# Patient Record
Sex: Female | Born: 1960 | Race: White | Hispanic: No | Marital: Married | Smoking: Former smoker
Health system: Southern US, Community
[De-identification: ages and names within clinical notes are randomized; demographics above are authoritative.]

## PROBLEM LIST (undated history)

## (undated) DIAGNOSIS — M1990 Unspecified osteoarthritis, unspecified site: Secondary | ICD-10-CM

## (undated) DIAGNOSIS — J45909 Unspecified asthma, uncomplicated: Secondary | ICD-10-CM

## (undated) HISTORY — PX: TUBAL LIGATION: SHX77

---

## 1999-01-11 ENCOUNTER — Encounter: Admission: RE | Admit: 1999-01-11 | Discharge: 1999-01-11 | Payer: Self-pay | Admitting: Family Medicine

## 1999-01-19 ENCOUNTER — Encounter: Admission: RE | Admit: 1999-01-19 | Discharge: 1999-01-19 | Payer: Self-pay | Admitting: Family Medicine

## 1999-02-09 ENCOUNTER — Encounter: Admission: RE | Admit: 1999-02-09 | Discharge: 1999-02-09 | Payer: Self-pay | Admitting: Family Medicine

## 1999-02-14 ENCOUNTER — Encounter: Admission: RE | Admit: 1999-02-14 | Discharge: 1999-02-14 | Payer: Self-pay | Admitting: Family Medicine

## 1999-03-01 ENCOUNTER — Encounter: Admission: RE | Admit: 1999-03-01 | Discharge: 1999-03-01 | Payer: Self-pay | Admitting: Family Medicine

## 1999-03-18 ENCOUNTER — Encounter: Admission: RE | Admit: 1999-03-18 | Discharge: 1999-03-18 | Payer: Self-pay | Admitting: Family Medicine

## 1999-05-19 ENCOUNTER — Encounter: Admission: RE | Admit: 1999-05-19 | Discharge: 1999-05-19 | Payer: Self-pay | Admitting: Family Medicine

## 1999-10-15 ENCOUNTER — Emergency Department (HOSPITAL_COMMUNITY): Admission: EM | Admit: 1999-10-15 | Discharge: 1999-10-15 | Payer: Self-pay | Admitting: *Deleted

## 2000-11-27 ENCOUNTER — Emergency Department (HOSPITAL_COMMUNITY): Admission: EM | Admit: 2000-11-27 | Discharge: 2000-11-27 | Payer: Self-pay | Admitting: Emergency Medicine

## 2001-01-13 ENCOUNTER — Ambulatory Visit (HOSPITAL_COMMUNITY): Admission: RE | Admit: 2001-01-13 | Discharge: 2001-01-13 | Payer: Self-pay | Admitting: *Deleted

## 2001-01-13 ENCOUNTER — Encounter: Payer: Self-pay | Admitting: General Practice

## 2001-11-06 ENCOUNTER — Ambulatory Visit: Admission: RE | Admit: 2001-11-06 | Discharge: 2001-11-06 | Payer: Self-pay | Admitting: *Deleted

## 2004-02-07 ENCOUNTER — Inpatient Hospital Stay (HOSPITAL_COMMUNITY): Admission: EM | Admit: 2004-02-07 | Discharge: 2004-02-09 | Payer: Self-pay | Admitting: Emergency Medicine

## 2010-11-13 ENCOUNTER — Encounter: Payer: Self-pay | Admitting: Family Medicine

## 2012-09-18 ENCOUNTER — Emergency Department (HOSPITAL_COMMUNITY)
Admission: EM | Admit: 2012-09-18 | Discharge: 2012-09-18 | Disposition: A | Discharge status: Home / Self Care | Payer: No Typology Code available for payment source | Attending: Emergency Medicine | Admitting: Emergency Medicine

## 2012-09-18 ENCOUNTER — Encounter (HOSPITAL_COMMUNITY): Payer: Self-pay | Admitting: *Deleted

## 2012-09-18 ENCOUNTER — Emergency Department (HOSPITAL_COMMUNITY): Payer: No Typology Code available for payment source

## 2012-09-18 DIAGNOSIS — S81009A Unspecified open wound, unspecified knee, initial encounter: Secondary | ICD-10-CM | POA: Insufficient documentation

## 2012-09-18 DIAGNOSIS — S81812A Laceration without foreign body, left lower leg, initial encounter: Secondary | ICD-10-CM

## 2012-09-18 DIAGNOSIS — S91009A Unspecified open wound, unspecified ankle, initial encounter: Secondary | ICD-10-CM | POA: Insufficient documentation

## 2012-09-18 DIAGNOSIS — Y9389 Activity, other specified: Secondary | ICD-10-CM | POA: Insufficient documentation

## 2012-09-18 DIAGNOSIS — Y92410 Unspecified street and highway as the place of occurrence of the external cause: Secondary | ICD-10-CM | POA: Insufficient documentation

## 2012-09-18 DIAGNOSIS — Z23 Encounter for immunization: Secondary | ICD-10-CM | POA: Insufficient documentation

## 2012-09-18 MED ORDER — TETANUS-DIPHTH-ACELL PERTUSSIS 5-2.5-18.5 LF-MCG/0.5 IM SUSP
0.5000 mL | Freq: Once | INTRAMUSCULAR | Status: AC
  Administered 2012-09-18: 0.5 mL via INTRAMUSCULAR
  Filled 2012-09-18: qty 0.5

## 2012-09-18 MED ORDER — HYDROCODONE-ACETAMINOPHEN 5-325 MG PO TABS
2.0000 | ORAL_TABLET | Freq: Once | ORAL | Status: AC
  Administered 2012-09-18: 2 via ORAL
  Filled 2012-09-18: qty 2
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
## 2012-09-18 NOTE — ED Notes (Signed)
MD at bedside. 
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
## 2012-10-02 ENCOUNTER — Encounter (HOSPITAL_COMMUNITY): Payer: Self-pay | Admitting: Emergency Medicine

## 2012-10-02 ENCOUNTER — Emergency Department (HOSPITAL_COMMUNITY)
Admission: EM | Admit: 2012-10-02 | Discharge: 2012-10-02 | Disposition: A | Discharge status: Home / Self Care | Payer: No Typology Code available for payment source | Attending: Emergency Medicine | Admitting: Emergency Medicine

## 2012-10-02 DIAGNOSIS — L02419 Cutaneous abscess of limb, unspecified: Secondary | ICD-10-CM | POA: Insufficient documentation

## 2012-10-02 DIAGNOSIS — IMO0002 Reserved for concepts with insufficient information to code with codable children: Secondary | ICD-10-CM

## 2012-10-02 DIAGNOSIS — L03116 Cellulitis of left lower limb: Secondary | ICD-10-CM

## 2012-10-02 HISTORY — DX: Unspecified osteoarthritis, unspecified site: M19.90

## 2012-10-02 MED ORDER — SULFAMETHOXAZOLE-TRIMETHOPRIM 800-160 MG PO TABS: 2.0000 | ORAL_TABLET | Freq: Two times a day (BID) | ORAL | Status: AC

## 2012-10-02 NOTE — ED Notes (Signed)
Pt reports increased redness and pain at suture line on l/calf. Marked redness noted. PA at bedside
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
## 2012-10-04 NOTE — ED Provider Notes (Signed)
Medical screening examination/treatment/procedure(s) were performed by non-physician practitioner and as supervising physician I was immediately available for consultation/collaboration.   Annaleese Guier, MD 10/04/12 1631 

## 2015-06-11 ENCOUNTER — Emergency Department (INDEPENDENT_AMBULATORY_CARE_PROVIDER_SITE_OTHER)
Admission: EM | Admit: 2015-06-11 | Discharge: 2015-06-11 | Disposition: A | Discharge status: Home / Self Care | Payer: Self-pay | Source: Home / Self Care | Attending: Family Medicine | Admitting: Family Medicine

## 2015-06-11 ENCOUNTER — Encounter (HOSPITAL_COMMUNITY): Payer: Self-pay | Admitting: Emergency Medicine

## 2015-06-11 DIAGNOSIS — L03115 Cellulitis of right lower limb: Secondary | ICD-10-CM

## 2015-06-11 MED ORDER — CEPHALEXIN 500 MG PO CAPS: 500.0000 mg | ORAL_CAPSULE | Freq: Four times a day (QID) | ORAL | Status: DC

## 2015-06-11 NOTE — ED Notes (Signed)
Called ortho tech 
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
## 2015-06-11 NOTE — ED Notes (Signed)
Spoke to ortho tech 

## 2015-06-11 NOTE — Discharge Instructions (Signed)
Leave leg wrapped until return on mon for recheck, take medicine as prescribed.

## 2015-06-14 ENCOUNTER — Encounter (HOSPITAL_COMMUNITY): Payer: Self-pay | Admitting: Emergency Medicine

## 2015-06-14 ENCOUNTER — Emergency Department (INDEPENDENT_AMBULATORY_CARE_PROVIDER_SITE_OTHER)
Admission: EM | Admit: 2015-06-14 | Discharge: 2015-06-14 | Disposition: A | Discharge status: Home / Self Care | Payer: Self-pay | Source: Home / Self Care | Attending: Family Medicine | Admitting: Family Medicine

## 2015-06-14 DIAGNOSIS — L03115 Cellulitis of right lower limb: Secondary | ICD-10-CM

## 2015-06-14 DIAGNOSIS — L0100 Impetigo, unspecified: Secondary | ICD-10-CM

## 2015-06-14 DIAGNOSIS — R609 Edema, unspecified: Secondary | ICD-10-CM

## 2015-06-14 MED ORDER — MUPIROCIN 2 % EX OINT: 1.0000 "application " | TOPICAL_OINTMENT | Freq: Two times a day (BID) | CUTANEOUS | Status: DC

## 2015-06-14 NOTE — ED Notes (Signed)
Patient was seen 8/19 for right lower leg wound.  Returned today for a recheck.  Una boot removed and lower leg cleansed with surclens.

## 2015-06-14 NOTE — Discharge Instructions (Signed)
The infection in your leg looks significantly better. Please continue with the antibiotics as prescribed. Please use the anabolic ointment in the future as needed for future wounds. Please remember to elevate your leg as much as possible. Please consider purchasing a pair of compression stockings to help with the fluid in your legs.

## 2015-06-14 NOTE — ED Provider Notes (Signed)
CSN: [id number]     Arrival date & time 06/14/15  1638 History   First MD Initiated Contact with Patient 06/14/15 1850     Chief Complaint  Patient presents with  . Wound Check   (Consider location/radiation/quality/duration/timing/severity/associated sxs/prior Treatment) HPI Patient suffered a right leg wound several weeks ago which turned into cellulitis. Patient was seen at the urgent care on 06/11/2015 and had an Unna boot placed and was put on antibiotic. Since that time she states that the wound has felt better. The edema has reduced significantly. She is taken anabolic as prescribed. Denies any fevers, joint effusions, rash, nausea, vomiting, diarrhea, abdominal pain, vaginal discharge or vaginal irritation. States that she has lower extremity edema at baseline. The problem is constant but getting better.   Past Medical History  Diagnosis Date  . Arthritis    Past Surgical History  Procedure Laterality Date  . Tubal ligation     Family History  Problem Relation Age of Onset  . Family history unknown: Yes   Social History  Substance Use Topics  . Smoking status: Never Smoker   . Smokeless tobacco: None  . Alcohol Use: No   OB History    No data available     Review of Systems Per HPI with all other pertinent systems negative.    Allergies  Review of patient's allergies indicates no known allergies.  Home Medications   Prior to Admission medications   Medication Sig Start Date End Date Taking? Authorizing Provider  albuterol (PROVENTIL HFA;VENTOLIN HFA) 108 (90 BASE) MCG/ACT inhaler Inhale 2 puffs into the lungs every 6 (six) hours as needed. For shortness of breath.    Historical Provider, MD  aspirin-acetaminophen-caffeine (EXCEDRIN MIGRAINE) [phone] MG per tablet Take 2 tablets by mouth every 8 (eight) hours as needed. For migraines.    Historical Provider, MD  cephALEXin (KEFLEX) 500 MG capsule Take 1 capsule (500 mg total) by mouth 4 (four) times daily. Take  all of medicine and drink lots of fluids 06/11/15   Linna Hoff, MD  ibuprofen (ADVIL,MOTRIN) 200 MG tablet Take 400 mg by mouth every 6 (six) hours as needed. Pain    Historical Provider, MD  mupirocin ointment (BACTROBAN) 2 % Apply 1 application topically 2 (two) times daily. 06/14/15   Ozella Rocks, MD   BP 149/96 mmHg  Pulse 69  Temp(Src) 98 F (36.7 C) (Oral)  Resp 16  SpO2 97% Physical Exam Physical Exam  Constitutional: oriented to person, place, and time. appears well-developed and well-nourished. No distress.  HENT:  Head: Normocephalic and atraumatic.  Eyes: EOMI. PERRL.  Neck: Normal range of motion.  Cardiovascular: RRR, no m/r/g, 2+ distal pulses,  Pulmonary/Chest: Effort normal and breath sounds normal. No respiratory distress.  Abdominal: Soft. Bowel sounds are normal. NonTTP, no distension.  Musculoskeletal: Normal range of motion. Non ttp, no effusion.  Neurological: alert and oriented to person, place, and time.  Skin: Mild skin maceration of the right lower anterior leg with 2 areas of ulceration. Minimal edema in the distribution of the Unna boot which was removed just prior to examination. Some surrounding erythema and induration present. Minimal tenderness to palpation.Marland Kitchen  Psychiatric: normal mood and affect. behavior is normal. Judgment and thought content normal.    ED Course  Procedures (including critical care time) Labs Review Labs Reviewed - No data to display  Imaging Review No results found.   MDM   1. Cellulitis of right lower extremity   2. Dependent edema  3. Impetigo    Continue antibiotic. No need for further in a boot. Patient start compression stockings for dependent edema. Mupirocin ointment for persistent weeping lesions or future open lesions as a preventative measure from this occurring again.    Ozella Rocks, MD 06/14/15 [phone]

## 2015-10-16 ENCOUNTER — Emergency Department (HOSPITAL_COMMUNITY): Payer: Self-pay

## 2015-10-16 ENCOUNTER — Encounter (HOSPITAL_COMMUNITY): Payer: Self-pay | Admitting: Emergency Medicine

## 2015-10-16 ENCOUNTER — Emergency Department (HOSPITAL_COMMUNITY)
Admission: EM | Admit: 2015-10-16 | Discharge: 2015-10-16 | Disposition: A | Discharge status: Home / Self Care | Payer: Self-pay | Attending: Emergency Medicine | Admitting: Emergency Medicine

## 2015-10-16 ENCOUNTER — Emergency Department (INDEPENDENT_AMBULATORY_CARE_PROVIDER_SITE_OTHER)
Admission: EM | Admit: 2015-10-16 | Discharge: 2015-10-16 | Disposition: A | Discharge status: Nursing Home (Includes ICF) | Payer: Self-pay | Source: Home / Self Care

## 2015-10-16 DIAGNOSIS — R42 Dizziness and giddiness: Secondary | ICD-10-CM | POA: Insufficient documentation

## 2015-10-16 DIAGNOSIS — R51 Headache: Secondary | ICD-10-CM | POA: Insufficient documentation

## 2015-10-16 DIAGNOSIS — M1990 Unspecified osteoarthritis, unspecified site: Secondary | ICD-10-CM | POA: Insufficient documentation

## 2015-10-16 LAB — CBC
HCT: 44 % (ref 36.0–46.0)
HEMOGLOBIN: 13.6 g/dL (ref 12.0–15.0)
MCH: 27.3 pg (ref 26.0–34.0)
MCHC: 30.9 g/dL (ref 30.0–36.0)
MCV: 88.4 fL (ref 78.0–100.0)
PLATELETS: 275 10*3/uL (ref 150–400)
RBC: 4.98 MIL/uL (ref 3.87–5.11)
RDW: 15.2 % (ref 11.5–15.5)
WBC: 6.9 10*3/uL (ref 4.0–10.5)

## 2015-10-16 LAB — URINALYSIS, ROUTINE W REFLEX MICROSCOPIC
BILIRUBIN URINE: NEGATIVE
Glucose, UA: NEGATIVE mg/dL
HGB URINE DIPSTICK: NEGATIVE
KETONES UR: NEGATIVE mg/dL
Leukocytes, UA: NEGATIVE
NITRITE: NEGATIVE
Protein, ur: NEGATIVE mg/dL
SPECIFIC GRAVITY, URINE: 1.009 (ref 1.005–1.030)
pH: 6.5 (ref 5.0–8.0)

## 2015-10-16 LAB — BASIC METABOLIC PANEL
ANION GAP: 9 (ref 5–15)
BUN: 11 mg/dL (ref 6–20)
CALCIUM: 9.6 mg/dL (ref 8.9–10.3)
CO2: 27 mmol/L (ref 22–32)
CREATININE: 0.82 mg/dL (ref 0.44–1.00)
Chloride: 103 mmol/L (ref 101–111)
GFR calc Af Amer: >60 mL/min (ref >60)
GLUCOSE: 109 mg/dL — AB (ref 65–99)
Potassium: 4.5 mmol/L (ref 3.5–5.1)
Sodium: 139 mmol/L (ref 135–145)

## 2015-10-16 MED ORDER — MECLIZINE HCL 25 MG PO TABS
25.0000 mg | ORAL_TABLET | Freq: Once | ORAL | Status: AC
  Administered 2015-10-16: 25 mg via ORAL
  Filled 2015-10-16: qty 1

## 2015-10-16 MED ORDER — SODIUM CHLORIDE 0.9 % IV BOLUS (SEPSIS)
1000.0000 mL | Freq: Once | INTRAVENOUS | Status: AC
Start: 2015-10-16 — End: 2015-10-16
  Administered 2015-10-16: 1000 mL via INTRAVENOUS

## 2015-10-16 MED ORDER — MECLIZINE HCL 25 MG PO TABS: 25.0000 mg | ORAL_TABLET | Freq: Three times a day (TID) | ORAL | Status: DC | PRN

## 2015-10-16 MED ORDER — METOCLOPRAMIDE HCL 5 MG/ML IJ SOLN
10.0000 mg | Freq: Once | INTRAMUSCULAR | Status: AC
  Administered 2015-10-16: 10 mg via INTRAVENOUS
  Filled 2015-10-16: qty 2

## 2015-10-16 MED ORDER — DIPHENHYDRAMINE HCL 50 MG/ML IJ SOLN
25.0000 mg | Freq: Once | INTRAMUSCULAR | Status: AC
  Administered 2015-10-16: 25 mg via INTRAVENOUS
  Filled 2015-10-16: qty 1

## 2015-10-16 NOTE — ED Provider Notes (Signed)
CSN: [id number]     Arrival date & time 10/16/15  1209 History   None    Chief Complaint  Patient presents with  . Dizziness   (Consider location/radiation/quality/duration/timing/severity/associated sxs/prior Treatment) HPI History obtained from patient:     Dizzy since yesterday. Symptoms are worse looking to the right. Some nausea. Symptoms acute onset yesterday morning. Was able to work.  Symptoms more constant today, yesterday several hours between episodes. 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  States symptoms just slowly get worse. 2 migraine type headaches since dizziness started.  Feels a little nauseated. No home treatment except lying down in bed. Symptoms are made worse with looking to the right or lifting her head. Also has weakness trying to get out of chair. Denies Head injury. Admits to some blurred vision.  Past Medical History  Diagnosis Date  . Arthritis    Past Surgical History  Procedure Laterality Date  . Tubal ligation     Family History  Problem Relation Age of Onset  . Family history unknown: Yes   Social History  Substance Use Topics  . Smoking status: Never Smoker   . Smokeless tobacco: None  . Alcohol Use: No   OB History    No data available     Review of Systems ROS +'ve dizziness, Headache Denies:  , NAUSEA, ABDOMINAL PAIN, CHEST PAIN, CONGESTION, DYSURIA, SHORTNESS OF BREATH  Allergies  Review of patient's allergies indicates no known allergies.  Home Medications   Prior to Admission medications   Medication Sig Start Date End Date Taking? Authorizing Provider  albuterol (PROVENTIL HFA;VENTOLIN HFA) 108 (90 BASE) MCG/ACT inhaler Inhale 2 puffs into the lungs every 6 (six) hours as needed. For shortness of breath.    Historical Provider, MD  aspirin-acetaminophen-caffeine (EXCEDRIN MIGRAINE) [phone] MG per tablet Take 2 tablets by mouth every 8 (eight) hours as needed. For migraines.    Historical Provider, MD  cephALEXin (KEFLEX) 500 MG capsule Take  1 capsule (500 mg total) by mouth 4 (four) times daily. Take all of medicine and drink lots of fluids 06/11/15   Linna HoffJames D Kindl, MD  ibuprofen (ADVIL,MOTRIN) 200 MG tablet Take 400 mg by mouth every 6 (six) hours as needed. Pain    Historical Provider, MD  mupirocin ointment (BACTROBAN) 2 % Apply 1 application topically 2 (two) times daily. 06/14/15   Ozella Rocksavid J Merrell, MD   Meds Ordered and Administered this Visit  Medications - No data to display  BP 81/56 mmHg  Pulse 64  Temp(Src) 97.5 F (36.4 C) (Oral)  Resp 20  SpO2 97% Orthostatic VS for the past 24 hrs:  BP- Lying Pulse- Lying BP- Sitting Pulse- Sitting BP- Standing at 0 minutes Pulse- Standing at 0 minutes  10/16/15 1238 (!) 167/97 mmHg 70 (!) 158/108 mmHg 64 (!) 172/126 mmHg 82    Physical Exam  Constitutional: She is oriented to person, place, and time. She appears well-developed and well-nourished. No distress.  HENT:  Head: Normocephalic and atraumatic.  Right Ear: External ear normal.  Left Ear: External ear normal.  Mouth/Throat: Oropharynx is clear and moist.  Eyes: Conjunctivae are normal. Pupils are equal, round, and reactive to light. Left eye exhibits nystagmus.  Pulmonary/Chest: Effort normal and breath sounds normal.  Neurological: She is alert and oriented to person, place, and time.  Skin: Skin is warm and dry.  Psychiatric: She has a normal mood and affect. Her behavior is normal. Judgment and thought content normal.  Nursing note and vitals reviewed.  ED Course  Procedures (including critical care time)  Labs Review Labs Reviewed - No data to display  Imaging Review No results found.   Visual Acuity Review  Right Eye Distance:   Left Eye Distance:   Bilateral Distance:    Right Eye Near:   Left Eye Near:    Bilateral Near:         MDM   1. Vertigo     Patient's multitude inconstant location of symptoms are greater than the ability of urgent care to care for and therefore she is  referred to the emergency department. Patient has obvious nystagmus on evaluation she is also hypertensive. She states that she usually wants a low blood pressure. She does have vertigo. She does have right temporal pain and the differential includes temporal arteritis. I have advised her to go to the emergency department for further testing and evaluation provided decision-making the emergency department provider. Instructions of care provided.      Tharon Aquas, PA 10/16/15 1309

## 2015-10-16 NOTE — ED Notes (Signed)
Dr. Yao at bedside. 

## 2015-10-16 NOTE — ED Notes (Addendum)
Pt sent from urgent care for further eval of dizziness onset yesterday morning when she tried to get up. No facial droop, speech clear, equal grips, no arm drift. Pt reports migraine on Tuesday and Thursday. Pt reports when she turns her head to the right the dizziness increases.

## 2015-10-16 NOTE — Discharge Instructions (Signed)
Stay hydrated.   Take meclizine for dizziness.   See your doctor.   Return to ER if you have worse dizziness, vomiting, headaches.

## 2015-10-16 NOTE — ED Provider Notes (Signed)
CSN: [id number]     Arrival date & time 10/16/15  1318 History   First MD Initiated Contact with Patient 10/16/15 1951     Chief Complaint  Patient presents with  . Dizziness     (Consider location/radiation/quality/duration/timing/severity/associated sxs/prior Treatment) The history is provided by the patient.  Cynthia Meyer is a 54 y.o. female here with dizziness. Patient states that she has been feeling dizzy since yesterday. Has some headaches as well. Patient states that is worse when she sits up or stands up. Denies any vomiting. Denies any fevers or neck pain. She went to urgent care and was sent here for further evaluation. Denies history of strokes.       Past Medical History  Diagnosis Date  . Arthritis    Past Surgical History  Procedure Laterality Date  . Tubal ligation     Family History  Problem Relation Age of Onset  . Family history unknown: Yes   Social History  Substance Use Topics  . Smoking status: Never Smoker   . Smokeless tobacco: None  . Alcohol Use: No   OB History    No data available     Review of Systems  Neurological: Positive for dizziness.  All other systems reviewed and are negative.     Allergies  Review of patient's allergies indicates no known allergies.  Home Medications   Prior to Admission medications   Medication Sig Start Date End Date Taking? Authorizing Provider  OVER THE COUNTER MEDICATION Apply 1 application topically daily as needed (for wound drying).   Yes Historical Provider, MD  albuterol (PROVENTIL HFA;VENTOLIN HFA) 108 (90 BASE) MCG/ACT inhaler Inhale 2 puffs into the lungs every 6 (six) hours as needed. For shortness of breath.    Historical Provider, MD  aspirin-acetaminophen-caffeine (EXCEDRIN MIGRAINE) [phone] MG per tablet Take 2 tablets by mouth every 8 (eight) hours as needed. For migraines.    Historical Provider, MD  ibuprofen (ADVIL,MOTRIN) 200 MG tablet Take 400 mg by mouth every 6 (six) hours as  needed. Pain    Historical Provider, MD  meclizine (ANTIVERT) 25 MG tablet Take 1 tablet (25 mg total) by mouth 3 (three) times daily as needed for dizziness. 10/16/15   Richardean Canalavid H Khyri Hinzman, MD   BP 123/75 mmHg  Pulse 71  Temp(Src) 97.8 F (36.6 C) (Oral)  Resp 20  Ht 5\' 6"  (1.676 m)  Wt 289 lb (131.09 kg)  BMI 46.67 kg/m2  SpO2 97% Physical Exam  Constitutional: She is oriented to person, place, and time. She appears well-developed and well-nourished.  HENT:  Head: Normocephalic.  Mouth/Throat: Oropharynx is clear and moist.  Eyes: Conjunctivae are normal. Pupils are equal, round, and reactive to light.  No obvious nystagmus   Neck: Normal range of motion. Neck supple.  Cardiovascular: Normal rate, regular rhythm and normal heart sounds.   Pulmonary/Chest: Effort normal and breath sounds normal. No respiratory distress. She has no wheezes. She has no rales.  Abdominal: Soft. Bowel sounds are normal. She exhibits no distension. There is no tenderness. There is no rebound.  Musculoskeletal: Normal range of motion.  Neurological: She is alert and oriented to person, place, and time. No cranial nerve deficit. Coordination normal.  Nl gait, CN 2-12 intact. Nl finger to nose   Skin: Skin is warm and dry.  Psychiatric: She has a normal mood and affect. Her behavior is normal. Judgment and thought content normal.  Nursing note and vitals reviewed.   ED Course  Procedures (including  critical care time) Labs Review Labs Reviewed  BASIC METABOLIC PANEL - Abnormal; Notable for the following:    Glucose, Bld 109 (*)    All other components within normal limits  CBC  URINALYSIS, ROUTINE W REFLEX MICROSCOPIC (NOT AT [facility])    Imaging Review Ct Head Wo Contrast  10/16/2015  CLINICAL DATA:  Dizziness for 2 days. Recent headache. Unable to sit up yesterday. EXAM: CT HEAD WITHOUT CONTRAST TECHNIQUE: Contiguous axial images were obtained from the base of the skull through the vertex without  intravenous contrast. COMPARISON:  None. FINDINGS: No acute cortical infarct, hemorrhage, or mass lesion is present. The ventricles are of normal size. No significant extra-axial fluid collection is evident. The paranasal sinuses and mastoid air cells are clear. The calvarium is intact. The globes and orbits are intact. 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 This appears benign. No focal scalp lesion is present. IMPRESSION: Negative CT of the head. Electronically Signed   By: Marin Roberts M.D.   On: 10/16/2015 20:45   I have personally reviewed and evaluated these images and lab results as part of my medical decision-making.   EKG Interpretation None      MDM   Final diagnoses:  Vertigo   Cynthia Meyer is a 54 y.o. female here with dizziness. Likely peripheral vertigo. CT head nl. Felt better with meclizine. Has nl gait. Likely peripheral vertigo. Will dc home with meclizine.    Richardean Canal, MD 10/16/15 [phone]

## 2015-10-16 NOTE — ED Notes (Signed)
Pt ambulatory to restroom; reports mild improvement of dizziness with position changes

## 2015-10-16 NOTE — ED Notes (Signed)
C/o feeling dizzy onset yest am associated w/nauseas and chills Reports sx increases when she turns to the right A&O x4... No acute distress.

## 2020-04-06 ENCOUNTER — Other Ambulatory Visit: Payer: Self-pay

## 2020-04-06 ENCOUNTER — Ambulatory Visit (HOSPITAL_COMMUNITY)
Admission: EM | Admit: 2020-04-06 | Discharge: 2020-04-06 | Disposition: A | Discharge status: Home / Self Care | Payer: HRSA Program | Attending: Internal Medicine | Admitting: Internal Medicine

## 2020-04-06 ENCOUNTER — Encounter (HOSPITAL_COMMUNITY): Payer: Self-pay

## 2020-04-06 ENCOUNTER — Emergency Department (HOSPITAL_COMMUNITY): Payer: HRSA Program

## 2020-04-06 ENCOUNTER — Encounter (HOSPITAL_COMMUNITY): Payer: Self-pay | Admitting: Emergency Medicine

## 2020-04-06 ENCOUNTER — Emergency Department (HOSPITAL_COMMUNITY)
Admission: EM | Admit: 2020-04-06 | Discharge: 2020-04-06 | Disposition: A | Discharge status: Home / Self Care | Payer: HRSA Program | Attending: Emergency Medicine | Admitting: Emergency Medicine

## 2020-04-06 DIAGNOSIS — U071 COVID-19: Secondary | ICD-10-CM | POA: Insufficient documentation

## 2020-04-06 DIAGNOSIS — J45909 Unspecified asthma, uncomplicated: Secondary | ICD-10-CM | POA: Insufficient documentation

## 2020-04-06 DIAGNOSIS — J129 Viral pneumonia, unspecified: Secondary | ICD-10-CM | POA: Diagnosis not present

## 2020-04-06 DIAGNOSIS — R0682 Tachypnea, not elsewhere classified: Secondary | ICD-10-CM

## 2020-04-06 DIAGNOSIS — R0789 Other chest pain: Secondary | ICD-10-CM

## 2020-04-06 DIAGNOSIS — Q8740 Marfan's syndrome, unspecified: Secondary | ICD-10-CM | POA: Insufficient documentation

## 2020-04-06 DIAGNOSIS — Z79899 Other long term (current) drug therapy: Secondary | ICD-10-CM | POA: Diagnosis not present

## 2020-04-06 DIAGNOSIS — R0602 Shortness of breath: Secondary | ICD-10-CM | POA: Diagnosis present

## 2020-04-06 DIAGNOSIS — R0603 Acute respiratory distress: Secondary | ICD-10-CM

## 2020-04-06 HISTORY — DX: Unspecified asthma, uncomplicated: J45.909

## 2020-04-06 LAB — TROPONIN I (HIGH SENSITIVITY): Troponin I (High Sensitivity): 6 ng/L (ref <18)

## 2020-04-06 LAB — CBC WITH DIFFERENTIAL/PLATELET
Abs Immature Granulocytes: 0.04 10*3/uL (ref 0.00–0.07)
Basophils Absolute: 0 10*3/uL (ref 0.0–0.1)
Basophils Relative: 0 %
Eosinophils Absolute: 0.1 10*3/uL (ref 0.0–0.5)
Eosinophils Relative: 1 %
HCT: 46.4 % — ABNORMAL HIGH (ref 36.0–46.0)
Hemoglobin: 14.2 g/dL (ref 12.0–15.0)
Immature Granulocytes: 1 %
Lymphocytes Relative: 24 %
Lymphs Abs: 1.2 10*3/uL (ref 0.7–4.0)
MCH: 26.9 pg (ref 26.0–34.0)
MCHC: 30.6 g/dL (ref 30.0–36.0)
MCV: 87.9 fL (ref 80.0–100.0)
Monocytes Absolute: 0.4 10*3/uL (ref 0.1–1.0)
Monocytes Relative: 7 %
Neutro Abs: 3.5 10*3/uL (ref 1.7–7.7)
Neutrophils Relative %: 67 %
Platelets: 168 10*3/uL (ref 150–400)
RBC: 5.28 MIL/uL — ABNORMAL HIGH (ref 3.87–5.11)
RDW: 16 % — ABNORMAL HIGH (ref 11.5–15.5)
WBC: 5.2 10*3/uL (ref 4.0–10.5)
nRBC: 0 % (ref 0.0–0.2)

## 2020-04-06 LAB — COMPREHENSIVE METABOLIC PANEL
ALT: 29 U/L (ref 0–44)
AST: 37 U/L (ref 15–41)
Albumin: 3.2 g/dL — ABNORMAL LOW (ref 3.5–5.0)
Alkaline Phosphatase: 70 U/L (ref 38–126)
Anion gap: 10 (ref 5–15)
BUN: 13 mg/dL (ref 6–20)
CO2: 25 mmol/L (ref 22–32)
Calcium: 8.4 mg/dL — ABNORMAL LOW (ref 8.9–10.3)
Chloride: 100 mmol/L (ref 98–111)
Creatinine, Ser: 0.87 mg/dL (ref 0.44–1.00)
GFR calc Af Amer: >60 mL/min (ref >60)
GFR calc non Af Amer: >60 mL/min (ref >60)
Glucose, Bld: 145 mg/dL — ABNORMAL HIGH (ref 70–99)
Potassium: 4.7 mmol/L (ref 3.5–5.1)
Sodium: 135 mmol/L (ref 135–145)
Total Bilirubin: 0.8 mg/dL (ref 0.3–1.2)
Total Protein: 6.4 g/dL — ABNORMAL LOW (ref 6.5–8.1)

## 2020-04-06 LAB — SARS CORONAVIRUS 2 BY RT PCR (HOSPITAL ORDER, PERFORMED IN ~~LOC~~ HOSPITAL LAB): SARS Coronavirus 2: POSITIVE — AB

## 2020-04-06 LAB — PROTIME-INR
INR: 0.9 (ref 0.8–1.2)
Prothrombin Time: 11.8 seconds (ref 11.4–15.2)

## 2020-04-06 LAB — I-STAT CHEM 8, ED
BUN: 16 mg/dL (ref 6–20)
Calcium, Ion: 0.97 mmol/L — ABNORMAL LOW (ref 1.15–1.40)
Chloride: 101 mmol/L (ref 98–111)
Creatinine, Ser: 0.8 mg/dL (ref 0.44–1.00)
Glucose, Bld: 139 mg/dL — ABNORMAL HIGH (ref 70–99)
HCT: 45 % (ref 36.0–46.0)
Hemoglobin: 15.3 g/dL — ABNORMAL HIGH (ref 12.0–15.0)
Potassium: 4.1 mmol/L (ref 3.5–5.1)
Sodium: 136 mmol/L (ref 135–145)
TCO2: 27 mmol/L (ref 22–32)

## 2020-04-06 MED ORDER — SODIUM CHLORIDE 0.9% FLUSH
3.0000 mL | Freq: Once | INTRAVENOUS | Status: AC
  Administered 2020-04-06: 3 mL via INTRAVENOUS

## 2020-04-06 MED ORDER — PREDNISONE 20 MG PO TABS
ORAL_TABLET | ORAL | 0 refills | Status: AC
Start: 2020-04-06 — End: ?

## 2020-04-06 MED ORDER — IOHEXOL 350 MG/ML SOLN
100.0000 mL | Freq: Once | INTRAVENOUS | Status: AC | PRN
  Administered 2020-04-06: 100 mL via INTRAVENOUS

## 2020-04-06 MED ORDER — PREDNISONE 20 MG PO TABS
60.0000 mg | ORAL_TABLET | Freq: Once | ORAL | Status: AC
  Administered 2020-04-06: 60 mg via ORAL
  Filled 2020-04-06: qty 3

## 2020-04-06 MED ORDER — LEVOFLOXACIN 750 MG PO TABS
750.0000 mg | ORAL_TABLET | Freq: Every day | ORAL | 0 refills | Status: DC
Start: 2020-04-06 — End: 2020-04-15

## 2020-04-06 MED ORDER — LEVOFLOXACIN 750 MG PO TABS
750.0000 mg | ORAL_TABLET | Freq: Once | ORAL | Status: AC
  Administered 2020-04-06: 750 mg via ORAL
  Filled 2020-04-06: qty 1

## 2020-04-06 MED ORDER — ALBUTEROL SULFATE HFA 108 (90 BASE) MCG/ACT IN AERS
2.0000 | INHALATION_SPRAY | Freq: Once | RESPIRATORY_TRACT | Status: AC
  Administered 2020-04-06: 2 via RESPIRATORY_TRACT
  Filled 2020-04-06: qty 6.7

## 2020-04-06 NOTE — ED Triage Notes (Signed)
Pt brought in [facility] from Urgent Care.   Pt's PA wanted her to be brought in to rule out a PE, based off of pt symptoms of chest pain since last thrusday and fatigue.  Pt denies history of PE, brought in on 2L.    Pt a&ox4, ambulatory on arrival.

## 2020-04-06 NOTE — Care Management (Signed)
ED CM received consult from EDP for [facility] patient is positive for Covid-19.  EDP spoke with patient regarding [facility] services patient is agreeable to accept service. ED CM contacted [facility] spoke with Dewayne Hatch who accepted the referral, which was  faxed in as well.  EDP mentioned patient may need home oxygen explained that patient O2 sats would have need to have dropped to 88% to qualify.  Explained that patient will be evaluated further by [facility] at home.

## 2020-04-06 NOTE — ED Provider Notes (Addendum)
[facility] EMERGENCY DEPARTMENT Provider Note   CSN: [id number] Arrival date & time: 04/06/20  2038     History Chief Complaint  Patient presents with  . Chest Pain    Cynthia Meyer is a 59 y.o. female hx of asthma, here presenting with shortness of breath.  Patient states that she recently traveled to [place] and drove back.  She states that she has some shortness of breath on exertion for the last several days.  She states that she has poor appetite overall and feels tired.  She feels like she cannot take a deep breath.  She went to urgent care was sent in for rule out ACS or PE.   The history is provided by the patient.       Past Medical History:  Diagnosis Date  . Arthritis   . Asthma     There are no problems to display for this patient.   Past Surgical History:  Procedure Laterality Date  . TUBAL LIGATION       OB History   No obstetric history on file.     Family History  Family history unknown: Yes    Social History   Tobacco Use  . Smoking status: Never Smoker  Substance Use Topics  . Alcohol use: No  . Drug use: Never    Home Medications Prior to Admission medications   Medication Sig Start Date End Date Taking? Authorizing Provider  albuterol (PROVENTIL HFA;VENTOLIN HFA) 108 (90 BASE) MCG/ACT inhaler Inhale 2 puffs into the lungs every 6 (six) hours as needed. For shortness of breath.    [provider]  aspirin-acetaminophen-caffeine (EXCEDRIN MIGRAINE) [phone] MG per tablet Take 2 tablets by mouth every 8 (eight) hours as needed. For migraines.    [provider]  ibuprofen (ADVIL,MOTRIN) 200 MG tablet Take 400 mg by mouth every 6 (six) hours as needed. Pain    [provider]  meclizine (ANTIVERT) 25 MG tablet Take 1 tablet (25 mg total) by mouth 3 (three) times daily as needed for dizziness. 10/16/15   Drenda Freeze, MD  OVER THE COUNTER MEDICATION Apply 1 application topically daily as  needed (for wound drying).    [provider]    Allergies    Patient has no known allergies.  Review of Systems   Review of Systems  Respiratory: Positive for shortness of breath.   Cardiovascular: Positive for chest pain.  All other systems reviewed and are negative.   Physical Exam Updated Vital Signs BP 139/81   Pulse 79   Temp 98.4 F (36.9 C) (Oral)   Resp 19   SpO2 95%   Physical Exam Vitals and nursing note reviewed.  HENT:     Head: Normocephalic.  Eyes:     Pupils: Pupils are equal, round, and reactive to light.  Cardiovascular:     Rate and Rhythm: Normal rate and regular rhythm.     Heart sounds: Normal heart sounds.  Pulmonary:     Effort: Pulmonary effort is normal.     Breath sounds: Normal breath sounds.  Abdominal:     General: Bowel sounds are normal.     Palpations: Abdomen is soft.  Musculoskeletal:        General: Normal range of motion.     Cervical back: Normal range of motion and neck supple.  Skin:    General: Skin is warm.     Capillary Refill: Capillary refill takes less than 2 seconds.  Neurological:     General: No focal deficit present.     Mental Status: She is alert and oriented to person, place, and time.  Psychiatric:        Mood and Affect: Mood normal.        Behavior: Behavior normal.     ED Results / Procedures / Treatments   Labs (all labs ordered are listed, but only abnormal results are displayed) Labs Reviewed  CBC WITH DIFFERENTIAL/PLATELET - Abnormal; Notable for the following components:      Result Value   RBC 5.28 (*)    HCT 46.4 (*)    RDW 16.0 (*)    All other components within normal limits  I-STAT CHEM 8, ED - Abnormal; Notable for the following components:   Glucose, Bld 139 (*)    Calcium, Ion 0.97 (*)    Hemoglobin 15.3 (*)    All other components within normal limits  SARS CORONAVIRUS 2 BY RT PCR (HOSPITAL ORDER, PERFORMED IN [facility])  PROTIME-INR  COMPREHENSIVE  METABOLIC PANEL  I-STAT BETA HCG BLOOD, ED ([facility], [facility], [facility] ONLY)  TROPONIN I (HIGH SENSITIVITY)    EKG EKG Interpretation  Date/Time:  Tuesday April 06 2020 20:45:01 EDT Ventricular Rate:  82 PR Interval:    QRS Duration: 88 QT Interval:  372 QTC Calculation: 435 R Axis:   69 Text Interpretation: Sinus rhythm Low voltage, precordial leads Borderline T abnormalities, anterior leads No significant change since last tracing Confirmed by Richardean Canal [phone]) on 04/06/2020 8:50:34 PM   Radiology DG Chest Port 1 View  Result Date: 04/06/2020 CLINICAL DATA:  Chest pain, fatigue EXAM: PORTABLE CHEST 1 VIEW COMPARISON:  02/06/2004 FINDINGS: Single frontal view of the chest demonstrates an unremarkable cardiac silhouette. No airspace disease, effusion, or pneumothorax. Chronic elevation right hemidiaphragm. IMPRESSION: 1. No acute intrathoracic process. Electronically Signed   By: Sharlet Salina M.D.   On: 04/06/2020 21:02    Procedures Procedures (including critical care time)  Medications Ordered in ED Medications  sodium chloride flush (NS) 0.9 % injection 3 mL (3 mLs Intravenous Given 04/06/20 2047)    ED Course  I have reviewed the triage vital signs and the nursing notes.  Pertinent labs & imaging results that were available during my care of the patient were reviewed by me and considered in my medical decision making (see chart for details).    MDM Rules/Calculators/A&P                          Cynthia Meyer is a 59 y.o. female here with chest pain and shortness of breath. Had recent travel so consider PE. Consider ACS as well and symptoms for 4 days so trop x 1 sufficient. I doubt dissection. Will get cbc, cmp, trop, CXR, CTA chest.    10:31 PM WBC is normal. Trop neg x 1. CT chest showed no PE, but there is pneumonia.  Patient has some nonproductive cough and chills.  Patient is afebrile here and is not hypoxic.  Her Covid test came back to be positive.  Since she is not hypoxic,  patient can be discharged home. Will refill her albuterol we will give a course of steroids and Levaquin. Told her to quarantine at home for 10 days. She qualifies for COVID to home program and Burna Mortimer the case manager will help set it up. She can get 2 L [place] as needed.   Cynthia Meyer was  evaluated in Emergency Department on 04/06/2020 for the symptoms described in the history of present illness. She was evaluated in the context of the global COVID-19 pandemic, which necessitated consideration that the patient might be at risk for infection with the SARS-CoV-2 virus that causes COVID-19. Institutional protocols and algorithms that pertain to the evaluation of patients at risk for COVID-19 are in a state of rapid change based on information released by regulatory bodies including the CDC and federal and state organizations. These policies and algorithms were followed during the patient's care in the ED.   Final Clinical Impression(s) / ED Diagnoses Final diagnoses:  None    Rx / DC Orders ED Discharge Orders    None       Charlynne Pander, MD 04/06/20 2248    Charlynne Pander, MD 04/06/20 2253

## 2020-04-06 NOTE — Discharge Instructions (Addendum)
Take prednisone as prescribed.   Take levaquin as prescribed   Use albuterol as needed for shortness of breath   Your shortness of breath is from COVID and pneumonia   Home health will check in on you tomorrow to set up oxygen delivery and check in on you   Quarantine for 10 days. See guidelines below   Return to ER if you have worse shortness of breath, fever, trouble breathing.      Person Under Monitoring Name: Cynthia Meyer  Location: [address] Cynthia Meyer [place] [address]   Infection Prevention Recommendations for Individuals Confirmed to have, or Being Evaluated for, 2019 Novel Coronavirus (COVID-19) Infection Who Receive Care at Home  Individuals who are confirmed to have, or are being evaluated for, COVID-19 should follow the prevention steps below until a healthcare provider or local or state health department says they can return to normal activities.  Stay home except to get medical care You should restrict activities outside your home, except for getting medical care. Do not go to work, school, or public areas, and do not use public transportation or taxis.  Call ahead before visiting your doctor Before your medical appointment, call the healthcare provider and tell them that you have, or are being evaluated for, COVID-19 infection. This will help the healthcare provider's office take steps to keep other people from getting infected. Ask your healthcare provider to call the local or state health department.  Monitor your symptoms Seek prompt medical attention if your illness is worsening (e.g., difficulty breathing). Before going to your medical appointment, call the healthcare provider and tell them that you have, or are being evaluated for, COVID-19 infection. Ask your healthcare provider to call the local or state health department.  Wear a facemask You should wear a facemask that covers your nose and mouth when you are in the same room with other  people and when you visit a healthcare provider. People who live with or visit you should also wear a facemask while they are in the same room with you.  Separate yourself from other people in your home As much as possible, you should stay in a different room from other people in your home. Also, you should use a separate bathroom, if available.  Avoid sharing household items You should not share dishes, drinking glasses, cups, eating utensils, towels, bedding, or other items with other people in your home. After using these items, you should wash them thoroughly with soap and water.  Cover your coughs and sneezes Cover your mouth and nose with a tissue when you cough or sneeze, or you can cough or sneeze into your sleeve. Throw used tissues in a lined trash can, and immediately wash your hands with soap and water for at least 20 seconds or use an alcohol-based hand rub.  Wash your [employer] your hands often and thoroughly with soap and water for at least 20 seconds. You can use an alcohol-based hand sanitizer if soap and water are not available and if your hands are not visibly dirty. Avoid touching your eyes, nose, and mouth with unwashed hands.   Prevention Steps for Caregivers and Household Members of Individuals Confirmed to have, or Being Evaluated for, COVID-19 Infection Being Cared for in the Home  If you live with, or provide care at home for, a person confirmed to have, or being evaluated for, COVID-19 infection please follow these guidelines to prevent infection:  Follow healthcare provider's instructions Make sure that you  understand and can help the patient follow any healthcare provider instructions for all care.  Provide for the patient's basic needs You should help the patient with basic needs in the home and provide support for getting groceries, prescriptions, and other personal needs.  Monitor the patient's symptoms If they are getting sicker, call his or her  medical provider and tell them that the patient has, or is being evaluated for, COVID-19 infection. This will help the healthcare provider's office take steps to keep other people from getting infected. Ask the healthcare provider to call the local or state health department.  Limit the number of people who have contact with the patient If possible, have only one caregiver for the patient. Other household members should stay in another home or place of residence. If this is not possible, they should stay in another room, or be separated from the patient as much as possible. Use a separate bathroom, if available. Restrict visitors who do not have an essential need to be in the home.  Keep older adults, very young children, and other sick people away from the patient Keep older adults, very young children, and those who have compromised immune systems or chronic health conditions away from the patient. This includes people with chronic heart, lung, or kidney conditions, diabetes, and cancer.  Ensure good ventilation Make sure that shared spaces in the home have good air flow, such as from an air conditioner or an opened window, weather permitting.  Wash your hands often Wash your hands often and thoroughly with soap and water for at least 20 seconds. You can use an alcohol based hand sanitizer if soap and water are not available and if your hands are not visibly dirty. Avoid touching your eyes, nose, and mouth with unwashed hands. Use disposable paper towels to dry your hands. If not available, use dedicated cloth towels and replace them when they become wet.  Wear a facemask and gloves Wear a disposable facemask at all times in the room and gloves when you touch or have contact with the patient's blood, body fluids, and/or secretions or excretions, such as sweat, saliva, sputum, nasal mucus, vomit, urine, or feces.  Ensure the mask fits over your nose and mouth tightly, and do not touch it  during use. Throw out disposable facemasks and gloves after using them. Do not reuse. Wash your hands immediately after removing your facemask and gloves. If your personal clothing becomes contaminated, carefully remove clothing and launder. Wash your hands after handling contaminated clothing. Place all used disposable facemasks, gloves, and other waste in a lined container before disposing them with other household waste. Remove gloves and wash your hands immediately after handling these items.  Do not share dishes, glasses, or other household items with the patient Avoid sharing household items. You should not share dishes, drinking glasses, cups, eating utensils, towels, bedding, or other items with a patient who is confirmed to have, or being evaluated for, COVID-19 infection. After the person uses these items, you should wash them thoroughly with soap and water.  Wash laundry thoroughly Immediately remove and wash clothes or bedding that have blood, body fluids, and/or secretions or excretions, such as sweat, saliva, sputum, nasal mucus, vomit, urine, or feces, on them. Wear gloves when handling laundry from the patient. Read and follow directions on labels of laundry or clothing items and detergent. In general, wash and dry with the warmest temperatures recommended on the label.  Clean all areas the individual has used often  Clean all touchable surfaces, such as counters, tabletops, doorknobs, bathroom fixtures, toilets, phones, keyboards, tablets, and bedside tables, every day. Also, clean any surfaces that may have blood, body fluids, and/or secretions or excretions on them. Wear gloves when cleaning surfaces the patient has come in contact with. Use a diluted bleach solution (e.g., dilute bleach with 1 part bleach and 10 parts water) or a household disinfectant with a label that says EPA-registered for coronaviruses. To make a bleach solution at home, add 1 tablespoon of bleach to 1  quart (4 cups) of water. For a larger supply, add  cup of bleach to 1 gallon (16 cups) of water. Read labels of cleaning products and follow recommendations provided on product labels. Labels contain instructions for safe and effective use of the cleaning product including precautions you should take when applying the product, such as wearing gloves or eye protection and making sure you have good ventilation during use of the product. Remove gloves and wash hands immediately after cleaning.  Monitor yourself for signs and symptoms of illness Caregivers and household members are considered close contacts, should monitor their health, and will be asked to limit movement outside of the home to the extent possible. Follow the monitoring steps for close contacts listed on the symptom monitoring form.   ? If you have additional questions, contact your local health department or call the epidemiologist on call at [phone] (available 24/7). ? This guidance is subject to change. For the most up-to-date guidance from [facility], please refer to their website: YouBlogs.pl

## 2020-04-06 NOTE — ED Triage Notes (Signed)
Sob, fatigue for 5 days.  Poor appetite.  Unknown fever, c/o dry cough

## 2020-04-06 NOTE — ED Notes (Signed)
Patient is being discharged from the Urgent Care and sent to the Emergency Department via [facility] . Per Dr Lacie Draft Darr, PA, patient is in need of higher level of care due to PE. Patient is aware and verbalizes understanding of plan of care.  Vitals:   04/06/20 1907 04/06/20 1950  BP: (!) 134/98   Pulse: 95   Resp: (!) 32   Temp: 99.7 F (37.6 C)   SpO2: 99% 93%

## 2020-04-06 NOTE — ED Provider Notes (Signed)
[place]    CSN: [id number] Arrival date & time: 04/06/20  1844      History   Chief Complaint Chief Complaint  Patient presents with  . Shortness of Breath    HPI Cynthia Meyer is a 59 y.o. female.   Patient presents for shortness of breath, chest tightness and fatigue.  She reports symptom started 5 days ago with profound fatigue.  She has had on and off episodes of shortness of breath.  She feels like she cannot take a deep breath.  She has a history of asthma and has been using her inhaler but this is not helped much.  She is also had on and off chest pressure.  She is endorsing shortness of breath and chest pressure here in clinic.  She does feel like she is breathing fast and cannot take a deep breath.  She has not had a fever that she is known.  She just feels very fatigued and spent most of the time over the last few days in the bed.  She has had no appetite.  She has not any nausea or vomiting.  She has been moving her stools regularly.  She takes no other medicines regularly the exception of having inhaler.  She does not endorse chest pain per se only pressure.  She does report shortness of breath is worse with ambulation.  She has not felt like she will pass out.  She does report a few days ago she had lower extremity swelling however this went away.  She is not taking blood pressure medicine.  Patient reports she smoked until the early 90s.  She has not smoked since then.  She takes no medicines.  She states her only medical history is been "Marfan syndrome but this is under control "     Past Medical History:  Diagnosis Date  . Arthritis   . Asthma     There are no problems to display for this patient.   Past Surgical History:  Procedure Laterality Date  . TUBAL LIGATION      OB History   No obstetric history on file.      Home Medications    Prior to Admission medications   Medication Sig Start Date End Date Taking? Authorizing Provider    albuterol (PROVENTIL HFA;VENTOLIN HFA) 108 (90 BASE) MCG/ACT inhaler Inhale 2 puffs into the lungs every 6 (six) hours as needed. For shortness of breath.   Yes [provider]  ibuprofen (ADVIL,MOTRIN) 200 MG tablet Take 400 mg by mouth every 6 (six) hours as needed. Pain   Yes [provider]  aspirin-acetaminophen-caffeine (EXCEDRIN MIGRAINE) [phone] MG per tablet Take 2 tablets by mouth every 8 (eight) hours as needed. For migraines.    [provider]  meclizine (ANTIVERT) 25 MG tablet Take 1 tablet (25 mg total) by mouth 3 (three) times daily as needed for dizziness. 10/16/15   Drenda Freeze, MD  OVER THE COUNTER MEDICATION Apply 1 application topically daily as needed (for wound drying).    [provider]    Family History Family History  Family history unknown: Yes    Social History Social History   Tobacco Use  . Smoking status: Never Smoker  Substance Use Topics  . Alcohol use: No  . Drug use: Never     Allergies   Patient has no known allergies.   Review of Systems Review of Systems   Physical Exam Triage Vital Signs ED Triage Vitals [  04/06/20 1902]  Enc Vitals Group     BP      Pulse      Resp      Temp      Temp src      SpO2      Weight      Height      Head Circumference      Peak Flow      Pain Score 0     Pain Loc      Pain Edu?      Excl. in GC?    No data found.  Updated Vital Signs BP (!) 134/98 (BP Location: Right Arm) Comment (BP Location): regular cuff to forearm  Pulse 95   Temp 99.7 F (37.6 C) (Oral)   Resp (!) 32   SpO2 93%   Visual Acuity Right Eye Distance:   Left Eye Distance:   Bilateral Distance:    Right Eye Near:   Left Eye Near:    Bilateral Near:     Physical Exam Vitals and nursing note reviewed.  Constitutional:      General: She is not in acute distress.    Appearance: She is well-developed.  HENT:     Head: Normocephalic and atraumatic.  Eyes:      Conjunctiva/sclera: Conjunctivae normal.  Cardiovascular:     Rate and Rhythm: Normal rate and regular rhythm.     Heart sounds: No murmur heard.   Pulmonary:     Effort: Accessory muscle usage and respiratory distress present.     Breath sounds: Normal breath sounds. No wheezing, rhonchi or rales.     Comments: Speaking in 3-4 word sentences with brief pauses with some labored breathing.  Tachypneic to the 30s at times desaturating to low 90s with exertion and long sentences Abdominal:     Palpations: Abdomen is soft.     Tenderness: There is no abdominal tenderness.  Musculoskeletal:     Cervical back: Neck supple.     Right lower leg: No edema.     Left lower leg: No edema.  Skin:    General: Skin is warm and dry.  Neurological:     Mental Status: She is alert.      UC Treatments / Results  Labs (all labs ordered are listed, but only abnormal results are displayed) Labs Reviewed  SARS CORONAVIRUS 2 (TAT 6-24 HRS)    EKG Normal sinus rhythm no ST elevation.  S1Q3T3 is present, abnormal EKG Radiology No results found.  Procedures Procedures (including critical care time)  Medications Ordered in UC Medications - No data to display  Initial Impression / Assessment and Plan / UC Course  I have reviewed the triage vital signs and the nursing notes.  Pertinent labs & imaging results that were available during my care of the patient were reviewed by me and considered in my medical decision making (see chart for details).     #Acute respiratory distress #Tachypnea #Chest pressure Patient is a 59 year old without reported medical history presenting in respiratory distress.  She is tachypneic with saturations as low as 90-91 with long sentences and exertion.  EKG with S1Q3T3.  Given tachypnea and respiratory distress, placed on 2 L oxygen and sent to emergency department via EMS for further evaluation tonight as we do not have a clear etiology for respiratory distress. DDx  to include ACS, PE, infectious, COPD.    Final Clinical Impressions(s) / UC Diagnoses   Final diagnoses:  Acute respiratory distress  Tachypnea  Chest pressure     Discharge Instructions     Transported to ED by EMS    ED Prescriptions    None     PDMP not reviewed this encounter.   Hermelinda Medicus, PA-C 04/06/20 2014

## 2020-04-06 NOTE — ED Notes (Signed)
Notified [facility] 

## 2020-04-06 NOTE — Discharge Instructions (Signed)
Transported to ED by EMS

## 2020-04-06 NOTE — ED Notes (Signed)
Labeled covid swab placed in lab

## 2020-04-07 ENCOUNTER — Telehealth: Payer: Self-pay | Admitting: Unknown Physician Specialty

## 2020-04-07 ENCOUNTER — Telehealth: Payer: Self-pay | Admitting: Adult Health

## 2020-04-07 LAB — SARS CORONAVIRUS 2 (TAT 6-24 HRS): SARS Coronavirus 2: POSITIVE — AB

## 2020-04-07 NOTE — Telephone Encounter (Signed)
Called to discuss with patient about Covid symptoms and the use of bamlanivimab, a monoclonal antibody infusion for those with mild to moderate Covid symptoms and at a high risk of hospitalization.  Pt is qualified for this infusion at the [facility] due to BMI>35   Message left to call back  

## 2020-04-07 NOTE — Telephone Encounter (Signed)
Called and LMOM about patient's recent COVID positivity and treatment with monoclonal antibody therapy with bamlanivimab.  She meets criteria for this treatment based on her BMI.  Asked that she call back to discuss further.    Lillard Anes, NP

## 2020-04-09 ENCOUNTER — Telehealth: Payer: Self-pay | Admitting: *Deleted

## 2020-04-09 ENCOUNTER — Telehealth (HOSPITAL_COMMUNITY): Payer: Self-pay | Admitting: Orthopedic Surgery

## 2020-04-09 NOTE — Telephone Encounter (Signed)
Pt called regarding [facility] services.  Pt states she has not had a visit from them and asked when her oxygen would be delivered.  RNCM reviewed chart to find that pt was referred to [facility] on 04/07/20 and did not qualify for home oxygen while in the ER.  RNCM contacted [facility] liaison, Cindra Eves, RN regarding this matter.  Lawson Fiscal states there are multiple notes that [facility] has been unsuccessful in contacting pt but has left materials at her home for her to call them at her convenience.  Lawson Fiscal states that pt is scheduled for a visit today, and she will reach out to her personally.

## 2020-04-09 NOTE — Telephone Encounter (Signed)
Pt returned phone and notified of positive Covid results. Pt states she is aware.    Pt verbalized understanding and had all questions answered.

## 2020-04-10 ENCOUNTER — Emergency Department (HOSPITAL_COMMUNITY): Payer: HRSA Program

## 2020-04-10 ENCOUNTER — Inpatient Hospital Stay (HOSPITAL_COMMUNITY)
Admission: EM | Admit: 2020-04-10 | Discharge: 2020-04-15 | DRG: 177 | Disposition: A | Discharge status: Home / Self Care | Payer: HRSA Program | Attending: Internal Medicine | Admitting: Internal Medicine

## 2020-04-10 ENCOUNTER — Telehealth: Payer: Self-pay

## 2020-04-10 ENCOUNTER — Encounter (HOSPITAL_COMMUNITY): Payer: Self-pay | Admitting: Emergency Medicine

## 2020-04-10 ENCOUNTER — Other Ambulatory Visit: Payer: Self-pay

## 2020-04-10 DIAGNOSIS — J1282 Pneumonia due to coronavirus disease 2019: Secondary | ICD-10-CM | POA: Diagnosis present

## 2020-04-10 DIAGNOSIS — I959 Hypotension, unspecified: Secondary | ICD-10-CM | POA: Diagnosis present

## 2020-04-10 DIAGNOSIS — E1165 Type 2 diabetes mellitus with hyperglycemia: Secondary | ICD-10-CM | POA: Diagnosis present

## 2020-04-10 DIAGNOSIS — Z87891 Personal history of nicotine dependence: Secondary | ICD-10-CM | POA: Diagnosis not present

## 2020-04-10 DIAGNOSIS — J45909 Unspecified asthma, uncomplicated: Secondary | ICD-10-CM | POA: Diagnosis present

## 2020-04-10 DIAGNOSIS — E876 Hypokalemia: Secondary | ICD-10-CM | POA: Diagnosis present

## 2020-04-10 DIAGNOSIS — Z6841 Body Mass Index (BMI) 40.0 and over, adult: Secondary | ICD-10-CM

## 2020-04-10 DIAGNOSIS — U071 COVID-19: Secondary | ICD-10-CM

## 2020-04-10 DIAGNOSIS — J9601 Acute respiratory failure with hypoxia: Secondary | ICD-10-CM | POA: Diagnosis present

## 2020-04-10 LAB — CBC WITH DIFFERENTIAL/PLATELET
Abs Immature Granulocytes: 0.06 10*3/uL (ref 0.00–0.07)
Basophils Absolute: 0 10*3/uL (ref 0.0–0.1)
Basophils Relative: 0 %
Eosinophils Absolute: 0 10*3/uL (ref 0.0–0.5)
Eosinophils Relative: 0 %
HCT: 45.4 % (ref 36.0–46.0)
Hemoglobin: 14.2 g/dL (ref 12.0–15.0)
Immature Granulocytes: 1 %
Lymphocytes Relative: 13 %
Lymphs Abs: 1 10*3/uL (ref 0.7–4.0)
MCH: 26.8 pg (ref 26.0–34.0)
MCHC: 31.3 g/dL (ref 30.0–36.0)
MCV: 85.8 fL (ref 80.0–100.0)
Monocytes Absolute: 0.5 10*3/uL (ref 0.1–1.0)
Monocytes Relative: 6 %
Neutro Abs: 6.6 10*3/uL (ref 1.7–7.7)
Neutrophils Relative %: 80 %
Platelets: 227 10*3/uL (ref 150–400)
RBC: 5.29 MIL/uL — ABNORMAL HIGH (ref 3.87–5.11)
RDW: 15.8 % — ABNORMAL HIGH (ref 11.5–15.5)
WBC: 8.2 10*3/uL (ref 4.0–10.5)
nRBC: 0 % (ref 0.0–0.2)

## 2020-04-10 LAB — COMPREHENSIVE METABOLIC PANEL
ALT: 23 U/L (ref 0–44)
AST: 18 U/L (ref 15–41)
Albumin: 3 g/dL — ABNORMAL LOW (ref 3.5–5.0)
Alkaline Phosphatase: 64 U/L (ref 38–126)
Anion gap: 11 (ref 5–15)
BUN: 14 mg/dL (ref 6–20)
CO2: 25 mmol/L (ref 22–32)
Calcium: 8.9 mg/dL (ref 8.9–10.3)
Chloride: 100 mmol/L (ref 98–111)
Creatinine, Ser: 0.86 mg/dL (ref 0.44–1.00)
GFR calc Af Amer: >60 mL/min (ref >60)
GFR calc non Af Amer: >60 mL/min (ref >60)
Glucose, Bld: 173 mg/dL — ABNORMAL HIGH (ref 70–99)
Potassium: 3.4 mmol/L — ABNORMAL LOW (ref 3.5–5.1)
Sodium: 136 mmol/L (ref 135–145)
Total Bilirubin: 0.5 mg/dL (ref 0.3–1.2)
Total Protein: 6.5 g/dL (ref 6.5–8.1)

## 2020-04-10 LAB — I-STAT CHEM 8, ED
BUN: 15 mg/dL (ref 6–20)
Calcium, Ion: 1.09 mmol/L — ABNORMAL LOW (ref 1.15–1.40)
Chloride: 98 mmol/L (ref 98–111)
Creatinine, Ser: 0.8 mg/dL (ref 0.44–1.00)
Glucose, Bld: 174 mg/dL — ABNORMAL HIGH (ref 70–99)
HCT: 46 % (ref 36.0–46.0)
Hemoglobin: 15.6 g/dL — ABNORMAL HIGH (ref 12.0–15.0)
Potassium: 3.3 mmol/L — ABNORMAL LOW (ref 3.5–5.1)
Sodium: 137 mmol/L (ref 135–145)
TCO2: 26 mmol/L (ref 22–32)

## 2020-04-10 LAB — D-DIMER, QUANTITATIVE: D-Dimer, Quant: 0.55 ug/mL-FEU — ABNORMAL HIGH (ref 0.00–0.50)

## 2020-04-10 LAB — PROCALCITONIN: Procalcitonin: <0.1 ng/mL

## 2020-04-10 LAB — LACTATE DEHYDROGENASE: LDH: 181 U/L (ref 98–192)

## 2020-04-10 LAB — FIBRINOGEN: Fibrinogen: 478 mg/dL — ABNORMAL HIGH (ref 210–475)

## 2020-04-10 LAB — I-STAT BETA HCG BLOOD, ED (MC, WL, AP ONLY): I-stat hCG, quantitative: <5 m[IU]/mL (ref <5)

## 2020-04-10 LAB — C-REACTIVE PROTEIN: CRP: 2.9 mg/dL — ABNORMAL HIGH (ref <1.0)

## 2020-04-10 LAB — LACTIC ACID, PLASMA: Lactic Acid, Venous: 1.5 mmol/L (ref 0.5–1.9)

## 2020-04-10 LAB — TRIGLYCERIDES: Triglycerides: 208 mg/dL — ABNORMAL HIGH (ref <150)

## 2020-04-10 LAB — CBG MONITORING, ED: Glucose-Capillary: 164 mg/dL — ABNORMAL HIGH (ref 70–99)

## 2020-04-10 LAB — FERRITIN: Ferritin: 154 ng/mL (ref 11–307)

## 2020-04-10 MED ORDER — ENOXAPARIN SODIUM 40 MG/0.4ML ~~LOC~~ SOLN
40.0000 mg | SUBCUTANEOUS | Status: DC
  Administered 2020-04-10 – 2020-04-11 (×2): 40 mg via SUBCUTANEOUS
  Filled 2020-04-10 (×2): qty 0.4

## 2020-04-10 MED ORDER — SODIUM CHLORIDE 0.9 % IV SOLN
100.0000 mg | Freq: Every day | INTRAVENOUS | Status: AC
  Administered 2020-04-11 – 2020-04-14 (×4): 100 mg via INTRAVENOUS
  Filled 2020-04-10 (×5): qty 20

## 2020-04-10 MED ORDER — ONDANSETRON HCL 4 MG PO TABS
4.0000 mg | ORAL_TABLET | Freq: Four times a day (QID) | ORAL | Status: DC | PRN
  Administered 2020-04-10: 4 mg via ORAL
  Filled 2020-04-10: qty 1

## 2020-04-10 MED ORDER — SODIUM CHLORIDE 0.9 % IV BOLUS
500.0000 mL | Freq: Once | INTRAVENOUS | Status: AC
  Administered 2020-04-10: 500 mL via INTRAVENOUS

## 2020-04-10 MED ORDER — ACETAMINOPHEN 325 MG PO TABS
650.0000 mg | ORAL_TABLET | Freq: Four times a day (QID) | ORAL | Status: DC | PRN
  Administered 2020-04-10: 650 mg via ORAL
  Filled 2020-04-10: qty 2

## 2020-04-10 MED ORDER — DEXAMETHASONE 6 MG PO TABS
6.0000 mg | ORAL_TABLET | ORAL | Status: DC
  Administered 2020-04-10 – 2020-04-14 (×5): 6 mg via ORAL
  Filled 2020-04-10: qty 1
  Filled 2020-04-10: qty 2
  Filled 2020-04-10 (×4): qty 1

## 2020-04-10 MED ORDER — INSULIN ASPART 100 UNIT/ML ~~LOC~~ SOLN: 0.0000 [IU] | Freq: Three times a day (TID) | SUBCUTANEOUS | Status: DC

## 2020-04-10 MED ORDER — IPRATROPIUM-ALBUTEROL 0.5-2.5 (3) MG/3ML IN SOLN: 3.0000 mL | RESPIRATORY_TRACT | Status: DC | PRN

## 2020-04-10 MED ORDER — ONDANSETRON HCL 4 MG/2ML IJ SOLN: 4.0000 mg | Freq: Four times a day (QID) | INTRAMUSCULAR | Status: DC | PRN

## 2020-04-10 MED ORDER — SODIUM CHLORIDE 0.9 % IV BOLUS
1000.0000 mL | Freq: Once | INTRAVENOUS | Status: AC
  Administered 2020-04-10: 1000 mL via INTRAVENOUS

## 2020-04-10 MED ORDER — SODIUM CHLORIDE 0.9 % IV SOLN
200.0000 mg | Freq: Once | INTRAVENOUS | Status: AC
  Administered 2020-04-10: 200 mg via INTRAVENOUS
  Filled 2020-04-10: qty 40

## 2020-04-10 MED ORDER — POTASSIUM CHLORIDE CRYS ER 20 MEQ PO TBCR
20.0000 meq | EXTENDED_RELEASE_TABLET | Freq: Once | ORAL | Status: AC
  Administered 2020-04-10: 20 meq via ORAL
  Filled 2020-04-10: qty 1

## 2020-04-10 MED ORDER — ALBUTEROL SULFATE HFA 108 (90 BASE) MCG/ACT IN AERS
2.0000 | INHALATION_SPRAY | Freq: Four times a day (QID) | RESPIRATORY_TRACT | Status: DC
  Administered 2020-04-10 – 2020-04-15 (×18): 2 via RESPIRATORY_TRACT
  Filled 2020-04-10 (×2): qty 6.7

## 2020-04-10 MED ORDER — GUAIFENESIN-DM 100-10 MG/5ML PO SYRP
10.0000 mL | ORAL_SOLUTION | ORAL | Status: DC | PRN
  Administered 2020-04-10: 10 mL via ORAL
  Filled 2020-04-10: qty 10

## 2020-04-10 NOTE — H&P (Signed)
Date: 04/10/2020               Patient Name:  Cynthia Meyer MRN: [id number]  DOB: August 03, 1961 Age / Sex: 59 y.o., female   PCP: Patient, No Pcp Per         Medical Service: Internal Medicine Teaching Service         Attending Physician: Dr. Quintella Reichert, MD    First Contact: Dr. Gilford Rile Pager: [phone]  Second Contact: Dr. Sharon Seller Pager: [phone]       After Hours (After 5p/  First Contact Pager: [phone]  weekends / holidays): Second Contact Pager: [phone]   Chief Complaint: shortness of breath  History of Present Illness:   She starting feel ill on June 10th and felt like she couldn't breathe or sleep. She would get better and worse and went to the ER. They did imaging and made sure she didn't have a blood clot on her lung. They gave her some prescriptions and let her go home. She endorses a bad fever this morning, dizziness, dry cough, nausea.   She has been able to eat and drink fluids. Yesterday she felt a little better but then her O2 dropped, which she has been checking at home. She has had a little change in taste, everything tastes more extreme than usual.   She does not take any medications. She has a history of asthma. She has a regular inhaler but is not sure what it is. She only uses her albuterol once in a while, maybe every few months. She was also told her thyroid was bad but she stopped taking synthroid because it didn't do anything.    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   Family History:  Marfan syndrome - hers is recessive   Meds:  No outpatient medications have been marked as taking for the 04/10/20 encounter [facility] Encounter).     Allergies: Allergies as of 04/10/2020  . (No Known Allergies)   Past Medical History:  Diagnosis Date  . Arthritis   . Asthma      Review of Systems: A complete ROS was negative except as per HPI.   Physical  Exam: Blood pressure 124/72, pulse 82, temperature 98.7 F (37.1 C), temperature source Oral, resp. rate (!) 26, SpO2 94 %.  Constitution: NAD, obese, supine in bed  HENT: [place]/AT Cardio: tachycardic, regular rhythm, no m/r/g, trace LE non-pitting edema  Respiratory: CTA, no wheezing, rales or rhonchi, on 7L [place], non-labored.  Abdominal: NTTP, soft, non-distended Neuro: a&o, pleasant Skin: c/d/i    EKG: personally reviewed my interpretation is low voltage normal sinus rhythm similar to prior  CXR: personally reviewed my interpretation is atelectasis in bases with increased interstitial markings similar to prior cxr  Assessment & Plan by Problem: Active Problems:   * No active hospital problems. *  Covid-19 Hx of Asthma  Diagnosed 6/15. CTA done in ED then as well and was negative for PE. Now requiring 7LNC. Received 1L bolus in ED for soft blood pressure, now improving.    - start remdesivir - dexamethasone 10 days - can possibly be shorter as she has already been on prednisone 4 days - albuterol inhaler prn  - am ferritin, fibrinogen, crp, d-dimer - bolus additional 1/2 L NS.  - PT - robitussin prn - supplemental O2 prn  Hyperglycemia Likely in setting or  steroids.   - a1c - SSI-sensitive   Diet: CM VTE: lovenox IVF: NS bolus Code: full  Dispo: Admit patient to Inpatient with expected length of stay greater than 2 midnights.  SignedVersie Starks, DO 04/10/2020, 4:55 PM  Pager: [phone]

## 2020-04-10 NOTE — ED Notes (Signed)
Pt SpO2 91% on 2L via [place]. Pt labored breathing, dyspnea at rest, tachypnic. Pt O2 increased to 6L via [place]

## 2020-04-10 NOTE — Telephone Encounter (Signed)
Received a call that patient had a prescription for oxygen and no-one ever came to bring it. Upon calling patient, niece answered. She states that a prescription was given to her for oxygen. Nobody came to  bring it. Looking at notes, patient did not qualify for oxygen at the time.  Explained how people qualify, even if there is a prescription, cannot be filled if there are no qualifying factors. Niece said patient looks worse, short of breath, temperature still (100.7) .gasping at times. I stated to her that if you feel she looks worse and she feels worse, and you want to come in, by all means get her rechecked. They will come back in for a recheck.Patient tested COVID positive when here. Niece stated that someone told here she was negative even though she tested positive? Confusing information. From niece.

## 2020-04-10 NOTE — ED Provider Notes (Signed)
Patient care assumed at 1500. Patient here for evaluation of increased shortness of breath and hypoxia. She was recently diagnosed with COVID-19 infection and does have a new oxygen requirement.  She did have transient hypotension and this was treated with an IV fluid bolus. Plan to admit for supplemental oxygen and ongoing treatment. Internal Medicine consulted for admission.   Tilden Fossa, MD 04/10/20 1626

## 2020-04-10 NOTE — ED Provider Notes (Signed)
[facility] EMERGENCY DEPARTMENT Provider Note   CSN: [id number] Arrival date & time: 04/10/20  1422     History Chief Complaint  Patient presents with  . Shortness of Breath  . COVID +    Cynthia Meyer is a 59 y.o. female.  59 yo F with a chief complaints of shortness of breath.  Patient was diagnosed with the novel coronavirus about 3 days ago.  Patient has been coughing and having trouble breathing.  Had some diarrhea at the onset of illness but nothing recently.  Going on for about 10 days.  She is supposed to be started on the home health program but unfortunately has not been able to have established.  Was found today by EMS to be profoundly hypoxic requiring nonrebreather to improve.  The history is provided by the patient.  Shortness of Breath Severity:  Moderate Onset quality:  Gradual Duration:  2 days Timing:  Constant Progression:  Worsening Chronicity:  New Relieved by:  Nothing Worsened by:  Nothing Ineffective treatments:  None tried Associated symptoms: no chest pain, no fever, no headaches, no vomiting and no wheezing        Past Medical History:  Diagnosis Date  . Arthritis   . Asthma     There are no problems to display for this patient.   Past Surgical History:  Procedure Laterality Date  . TUBAL LIGATION       OB History   No obstetric history on file.     Family History  Family history unknown: Yes    Social History   Tobacco Use  . Smoking status: Former Smoker    Quit date: 1993    Years since quitting: 28.4  . Smokeless tobacco: Never Used  Substance Use Topics  . Alcohol use: No  . Drug use: Never    Home Medications Prior to Admission medications   Medication Sig Start Date End Date Taking? Authorizing Provider  albuterol (PROVENTIL HFA;VENTOLIN HFA) 108 (90 BASE) MCG/ACT inhaler Inhale 2 puffs into the lungs every 6 (six) hours as needed for wheezing or shortness of breath.     [provider]  aspirin-acetaminophen-caffeine (EXCEDRIN MIGRAINE) [phone] MG per tablet Take 2 tablets by mouth every 8 (eight) hours as needed for headache or migraine. For migraines.     [provider]  ibuprofen (ADVIL,MOTRIN) 200 MG tablet Take 400 mg by mouth every 6 (six) hours as needed for moderate pain.     [provider]  levofloxacin (LEVAQUIN) 750 MG tablet Take 1 tablet (750 mg total) by mouth daily. X 7 days 04/06/20   Charlynne Pander, MD  meclizine (ANTIVERT) 25 MG tablet Take 1 tablet (25 mg total) by mouth 3 (three) times daily as needed for dizziness. Patient not taking: Reported on 04/06/2020 10/16/15   Charlynne Pander, MD  predniSONE (DELTASONE) 20 MG tablet Take 60 mg daily x 2 days then 40 mg daily x 2 days then 20 mg daily x 2 days 04/06/20   Charlynne Pander, MD    Allergies    Patient has no known allergies.  Review of Systems   Review of Systems  Constitutional: Negative for chills and fever.  HENT: Negative for congestion and rhinorrhea.   Eyes: Negative for redness and visual disturbance.  Respiratory: Positive for shortness of breath. Negative for wheezing.   Cardiovascular: Negative for chest pain and palpitations.  Gastrointestinal: Negative for nausea and vomiting.  Genitourinary: Negative for dysuria  and urgency.  Musculoskeletal: Negative for arthralgias and myalgias.  Skin: Negative for pallor and wound.  Neurological: Negative for dizziness and headaches.    Physical Exam Updated Vital Signs BP 115/68 (BP Location: Right Arm)   Pulse 92   Temp 98.7 F (37.1 C) (Oral)   Resp (!) 28   SpO2 92%   Physical Exam Vitals and nursing note reviewed.  Constitutional:      General: She is not in acute distress.    Appearance: She is well-developed. She is morbidly obese. She is not diaphoretic.  HENT:     Head: Normocephalic and atraumatic.  Eyes:     Pupils: Pupils are equal, round, and reactive to light.    Cardiovascular:     Rate and Rhythm: Normal rate and regular rhythm.     Heart sounds: No murmur heard.  No friction rub. No gallop.   Pulmonary:     Effort: Pulmonary effort is normal. Tachypnea present.     Breath sounds: No wheezing or rales.  Abdominal:     General: There is no distension.     Palpations: Abdomen is soft.     Tenderness: There is no abdominal tenderness.  Musculoskeletal:        General: No tenderness.     Cervical back: Normal range of motion and neck supple.  Skin:    General: Skin is warm and dry.  Neurological:     Mental Status: She is alert and oriented to person, place, and time.  Psychiatric:        Behavior: Behavior normal.     ED Results / Procedures / Treatments   Labs (all labs ordered are listed, but only abnormal results are displayed) Labs Reviewed  CULTURE, BLOOD (ROUTINE X 2)  CULTURE, BLOOD (ROUTINE X 2)  LACTIC ACID, PLASMA  LACTIC ACID, PLASMA  CBC WITH DIFFERENTIAL/PLATELET  COMPREHENSIVE METABOLIC PANEL  D-DIMER, QUANTITATIVE (NOT AT [facility])  PROCALCITONIN  LACTATE DEHYDROGENASE  FERRITIN  TRIGLYCERIDES  FIBRINOGEN  C-REACTIVE PROTEIN  I-STAT BETA HCG BLOOD, ED ([facility], [facility], [facility] ONLY)  I-STAT CHEM 8, ED    EKG EKG Interpretation  Date/Time:  Saturday April 10 2020 14:28:41 EDT Ventricular Rate:  99 PR Interval:    QRS Duration: 79 QT Interval:  330 QTC Calculation: 424 R Axis:   32 Text Interpretation: Sinus rhythm Low voltage, precordial leads Borderline T abnormalities, diffuse leads No significant change was found Confirmed by Quintella Reichert [phone]) on 04/10/2020 2:47:14 PM   Radiology No results found.  Procedures Procedures (including critical care time)  Medications Ordered in ED Medications - No data to display  ED Course  I have reviewed the triage vital signs and the nursing notes.  Pertinent labs & imaging results that were available during my care of the patient were reviewed by me and considered in  my medical decision making (see chart for details).    MDM Rules/Calculators/A&P                          59 yo F with a cc of sob.  Now on day 10 of covid symptoms.  Maintaining O2 sat of 88-92 on 2L at rest.  +tachpnea.  Likely needs admission post lab eval and cxr.   The patients results and plan were reviewed and discussed.   Any x-rays performed were independently reviewed by myself.   Differential diagnosis were considered with the presenting HPI.  Medications - No data to  display  Vitals:   04/10/20 1423 04/10/20 1431  BP:  115/68  Pulse:  92  Resp:  (!) 28  Temp:  98.7 F (37.1 C)  TempSrc:  Oral  SpO2: 96% 92%    Final diagnoses:  COVID-19 virus infection  Acute respiratory failure with hypoxia (HCC)     Final Clinical Impression(s) / ED Diagnoses Final diagnoses:  COVID-19 virus infection  Acute respiratory failure with hypoxia [facility])    Rx / DC Orders ED Discharge Orders    None       Melene Plan, DO 04/10/20 1456

## 2020-04-10 NOTE — ED Triage Notes (Signed)
Pt here from home via [facility] for sob and chest pressure after being diagnosed w/ covid on 6/15. Pt was prescribed home O2 but it was never delivered and hasn't been able to get in touch with home health to get it. Pt was 86% on RA, placed on NRB at 8L, SpO2 96% per EMS. Pt has hx asthma. Aox4, vss

## 2020-04-11 DIAGNOSIS — E1165 Type 2 diabetes mellitus with hyperglycemia: Secondary | ICD-10-CM | POA: Diagnosis present

## 2020-04-11 LAB — GLUCOSE, CAPILLARY
Glucose-Capillary: 258 mg/dL — ABNORMAL HIGH (ref 70–99)
Glucose-Capillary: 265 mg/dL — ABNORMAL HIGH (ref 70–99)
Glucose-Capillary: 217 mg/dL — ABNORMAL HIGH (ref 70–99)
Glucose-Capillary: 169 mg/dL — ABNORMAL HIGH (ref 70–99)
Glucose-Capillary: 203 mg/dL — ABNORMAL HIGH (ref 70–99)

## 2020-04-11 LAB — HIV ANTIBODY (ROUTINE TESTING W REFLEX): HIV Screen 4th Generation wRfx: NONREACTIVE

## 2020-04-11 LAB — C-REACTIVE PROTEIN: CRP: 7.8 mg/dL — ABNORMAL HIGH (ref <1.0)

## 2020-04-11 LAB — FIBRINOGEN: Fibrinogen: 595 mg/dL — ABNORMAL HIGH (ref 210–475)

## 2020-04-11 LAB — D-DIMER, QUANTITATIVE: D-Dimer, Quant: 0.49 ug/mL-FEU (ref 0.00–0.50)

## 2020-04-11 LAB — HEMOGLOBIN A1C
Hgb A1c MFr Bld: 7.5 % — ABNORMAL HIGH (ref 4.8–5.6)
Mean Plasma Glucose: 168.55 mg/dL

## 2020-04-11 LAB — FERRITIN: Ferritin: 164 ng/mL (ref 11–307)

## 2020-04-11 MED ORDER — POTASSIUM CHLORIDE CRYS ER 20 MEQ PO TBCR: 20.0000 meq | EXTENDED_RELEASE_TABLET | Freq: Two times a day (BID) | ORAL | Status: DC

## 2020-04-11 MED ORDER — INSULIN GLARGINE 100 UNIT/ML ~~LOC~~ SOLN
10.0000 [IU] | Freq: Every day | SUBCUTANEOUS | Status: DC
  Administered 2020-04-11: 10 [IU] via SUBCUTANEOUS
  Filled 2020-04-11 (×3): qty 0.1

## 2020-04-11 MED ORDER — INSULIN GLARGINE 100 UNIT/ML ~~LOC~~ SOLN
5.0000 [IU] | Freq: Every day | SUBCUTANEOUS | Status: DC
  Filled 2020-04-11: qty 0.05

## 2020-04-11 MED ORDER — INSULIN ASPART 100 UNIT/ML ~~LOC~~ SOLN
0.0000 [IU] | Freq: Three times a day (TID) | SUBCUTANEOUS | Status: DC
  Administered 2020-04-11: 4 [IU] via SUBCUTANEOUS
  Administered 2020-04-11: 11 [IU] via SUBCUTANEOUS
  Administered 2020-04-11: 7 [IU] via SUBCUTANEOUS
  Administered 2020-04-12: 11 [IU] via SUBCUTANEOUS
  Administered 2020-04-12: 7 [IU] via SUBCUTANEOUS
  Administered 2020-04-12 – 2020-04-13 (×3): 11 [IU] via SUBCUTANEOUS

## 2020-04-11 MED ORDER — POTASSIUM CHLORIDE CRYS ER 20 MEQ PO TBCR
20.0000 meq | EXTENDED_RELEASE_TABLET | Freq: Two times a day (BID) | ORAL | Status: AC
  Administered 2020-04-11 (×2): 20 meq via ORAL
  Filled 2020-04-11 (×2): qty 1

## 2020-04-11 NOTE — Progress Notes (Signed)
Subjective:  O/N Events: None   Patient evaluated at bedside. Patient states that she is feeling slightly better today than yesterday. She attributes this to being able to eat breakfast. We discussed her A1c results and the need for lifestyle changes and possible Metformin use in the outpatient setting. Patient voiced understanding. All questions and concerns were addressed.   Objective:  Vital signs in last 24 hours: Vitals:   04/11/20 0000 04/11/20 0103 04/11/20 0105 04/11/20 0400  BP: 119/68 119/70    Pulse: 75 73    Resp: 20 (!) 21  (!) 21  Temp:   100 F (37.8 C) 99.3 F (37.4 C)  TempSrc:   Oral Oral  SpO2: 94% 92%    Weight:   (!) 148.6 kg   Height:   5\' 6"  (1.676 m)    Physical Exam Constitutional:      Appearance: She is well-developed.  Cardiovascular:     Rate and Rhythm: Normal rate and regular rhythm.     Heart sounds: No murmur heard.  No friction rub. No gallop.   Pulmonary:     Effort: Pulmonary effort is normal. No tachypnea.     Breath sounds: Normal breath sounds. No decreased breath sounds, wheezing, rhonchi or rales.  Abdominal:     General: Bowel sounds are normal.     Palpations: Abdomen is soft.     Tenderness: There is no abdominal tenderness.  Musculoskeletal:     Right lower leg: No tenderness. No edema.     Left lower leg: No tenderness. No edema.  Neurological:     Mental Status: She is alert.      LABS:  CBC Latest Ref Rng & Units 04/10/2020 04/10/2020 04/06/2020  WBC 4.0 - 10.5 K/uL - 8.2 -  Hemoglobin 12.0 - 15.0 g/dL 15.6(H) 14.2 15.3(H)  Hematocrit 36 - 46 % 46.0 45.4 45.0  Platelets 150 - 400 K/uL - 227 -   CMP Latest Ref Rng & Units 04/10/2020 04/10/2020 04/06/2020  Glucose 70 - 99 mg/dL 04/08/2020) 858(I) 502(D)  BUN 6 - 20 mg/dL 15 14 16   Creatinine 0.44 - 1.00 mg/dL 741(O 8.78  Sodium 135 - 145 mmol/L 137 136 136  Potassium 3.5 - 5.1 mmol/L 3.3(L) 3.4(L) 4.1  Chloride 98 - 111 mmol/L 98 100 101  CO2 22 - 32 mmol/L - 25 -    Calcium 8.9 - 10.3 mg/dL - 8.9 -  Total Protein 6.5 - 8.1 g/dL - 6.5 -  Total Bilirubin 0.3 - 1.2 mg/dL - 0.5 -  Alkaline Phos 38 - 126 U/L - 64 -  AST 15 - 41 U/L - 18 -  ALT 0 - 44 U/L - 23 -     Assessment/Plan:  Active Problems:   COVID-19 virus infection  Ms. Khamila Bassinger is a 59 y/o female with a PMH of Asthma who presents to [facility] with known COVID infection, admitted for treatment of COVID 19.  Covid-19 Hx of Asthma  Diagnosed 6/15. CTA done in ED then as well and was negative for PE. Currently on 5L of supplemental O2.   - remdesivir day 2 - Decadron 6mg  QD - albuterol inhaler prn  - am ferritin, fibrinogen, crp, d-dimer - bolus additional 1/2 L NS.  - PT - robitussin prn - supplemental O2 prn  Hyperglycemia Likely in setting or steroids. A1c of 7.5. - SSI-resistant - Lantus 5  Prior to Admission Living Arrangement: Home Anticipated Discharge Location: Home Barriers to Discharge: Continued treatment  Dispo: Anticipated discharge in approximately 3-4 day(s).   [name], MD 04/11/2020, 6:24 AM Pager: [phone] After 5pm on weekdays and 1pm on weekends: On Call pager [phone]

## 2020-04-11 NOTE — Evaluation (Signed)
Physical Therapy Evaluation Patient Details Name: Cynthia Meyer MRN: [id number] DOB: [birth year] Today's Date: 04/11/2020   History of Present Illness  Pt is a 59 y.o. female admitted 04/10/20 with worsening SOB and hypoxia after recent dx of COVID-19. Pt also noted to be hyperglycemic. PMH includes obesity, asthma.    Clinical Impression  Pt presents with an overall decrease in functional mobility secondary to above. PTA, pt independent, works, and lives with sister. Today, pt moving with supervision for line management, limited by decreased activity tolerance and hypoxia with minimal activity. SpO2 down to 83% on 5L O2 [place] after ~2 min standing activity. Increased time discussing importance of mobility and educ on supine/seated/standing therex options and frequency. Educ on pursed lip breathing and seated rest to recover when SpO2 drops below 88%. Pt motivated to participate and return home. Will follow acutely to address established goals.    Follow Up Recommendations No PT follow up;Supervision - Intermittent    Equipment Recommendations  None recommended by PT (potentially home O2)    Recommendations for Other Services       Precautions / Restrictions Precautions Precautions: Fall;Other (comment) Precaution Comments: Watch SpO2 Restrictions Weight Bearing Restrictions: No      Mobility  Bed Mobility Overal bed mobility: Modified Independent             General bed mobility comments: HOB elevated  Transfers Overall transfer level: Independent Equipment used: None                Ambulation/Gait Ambulation/Gait assistance: Supervision Gait Distance (Feet): 20 Feet Assistive device: None Gait Pattern/deviations: Step-through pattern;Decreased stride length     General Gait Details: Performed multiple bouts of marching in place, and walking forwards/backwards within confines of O2 extension/lines; intermittent sitting rest breaks with SpO2 down to 83% on 5L O2  [place]. Return to 88-92% on 5L with ~60sec seated rest; cues for pursed lip breathing  Stairs            Wheelchair Mobility    Modified Rankin (Stroke Patients Only)       Balance Overall balance assessment: No apparent balance deficits (not formally assessed)                                           Pertinent Vitals/Pain Pain Assessment: Faces Faces Pain Scale: No hurt Pain Intervention(s): Monitored during session    Home Living Family/patient expects to be discharged to:: Private residence Living Arrangements: Other relatives Available Help at Discharge: Family;Available PRN/intermittently Type of Home: Apartment Home Access: Stairs to enter Entrance Stairs-Rails: Right Entrance Stairs-Number of Steps: Flight Home Layout: One level Home Equipment: None Additional Comments: Lives with sister who works during day    Prior Function Level of Independence: Independent         Comments: Independent, works cleaning houses; sometimes has difficulty with steps, but just has to stop and catch breath. This has been especially bad since COVID symptoms started     Hand Dominance        Extremity/Trunk Assessment   Upper Extremity Assessment Upper Extremity Assessment: Overall WFL for tasks assessed    Lower Extremity Assessment Lower Extremity Assessment: Overall WFL for tasks assessed       Communication   Communication: No difficulties  Cognition Arousal/Alertness: Awake/alert Behavior During Therapy: WFL for tasks assessed/performed Overall Cognitive Status: Within Functional Limits for tasks assessed  General Comments General comments (skin integrity, edema, etc.): Educ on pursed lip breathing, importance of shorter bouts of more frequent activity (pt currently handling ~2 min standing activity before needing seated rest to recover SpO2); educ on listening to monitor alarming and  watching SpO2 number to know when she needs to take break for deep breathing    Exercises Other Exercises Other Exercises: Repeated sit<>stands, bouts of marching in place, pt to perform sitting EOB and/or standing activity every waking hour; educ on bed level BUE/BLE ROM to do during tv commercial breaks.   Assessment/Plan    PT Assessment Patient needs continued PT services  PT Problem List Decreased activity tolerance;Decreased mobility;Cardiopulmonary status limiting activity       PT Treatment Interventions Gait training;Stair training;Functional mobility training;Therapeutic exercise;Patient/family education    PT Goals (Current goals can be found in the Care Plan section)  Acute Rehab PT Goals Patient Stated Goal: Get back home and back to work PT Goal Formulation: With patient Time For Goal Achievement: 04/25/20 Potential to Achieve Goals: Good    Frequency Min 3X/week   Barriers to discharge        Co-evaluation               AM-PAC PT "6 Clicks" Mobility  Outcome Measure Help needed turning from your back to your side while in a flat bed without using bedrails?: None Help needed moving from lying on your back to sitting on the side of a flat bed without using bedrails?: None Help needed moving to and from a bed to a chair (including a wheelchair)?: None Help needed standing up from a chair using your arms (e.g., wheelchair or bedside chair)?: None Help needed to walk in hospital room?: None Help needed climbing 3-5 steps with a railing? : A Little 6 Click Score: 23    End of Session Equipment Utilized During Treatment: Oxygen Activity Tolerance: Patient tolerated treatment well;Patient limited by fatigue Patient left: in bed;with call bell/phone within reach Nurse Communication: Mobility status PT Visit Diagnosis: Other abnormalities of gait and mobility (R26.89)    Time: 3149-7026 PT Time Calculation (min) (ACUTE ONLY): 20 min   Charges:   PT  Evaluation $PT Eval Moderate Complexity: 1 Mod     Cynthia Meyer, PT, DPT Acute Rehabilitation Services  Pager [phone] Office [phone]  Cynthia Meyer 04/11/2020, 3:05 PM

## 2020-04-11 NOTE — Progress Notes (Signed)
Patient received to the unit. pateint is alert and oriented x4. Vital signs are stable. Skin assessment done with another nurse. Iv in place. No distress noted. Given instructions to the patient about call bell and phone. Bed in low position and call in reach.

## 2020-04-11 NOTE — Plan of Care (Signed)
  Problem: Clinical Measurements: Goal: Respiratory complications will improve Outcome: Progressing   

## 2020-04-12 LAB — CBC
HCT: 46.2 % — ABNORMAL HIGH (ref 36.0–46.0)
Hemoglobin: 14.1 g/dL (ref 12.0–15.0)
MCH: 26.9 pg (ref 26.0–34.0)
MCHC: 30.5 g/dL (ref 30.0–36.0)
MCV: 88 fL (ref 80.0–100.0)
Platelets: 247 10*3/uL (ref 150–400)
RBC: 5.25 MIL/uL — ABNORMAL HIGH (ref 3.87–5.11)
RDW: 15.9 % — ABNORMAL HIGH (ref 11.5–15.5)
WBC: 7.1 10*3/uL (ref 4.0–10.5)
nRBC: 0 % (ref 0.0–0.2)

## 2020-04-12 LAB — BASIC METABOLIC PANEL
Anion gap: 8 (ref 5–15)
BUN: 16 mg/dL (ref 6–20)
CO2: 28 mmol/L (ref 22–32)
Calcium: 8.8 mg/dL — ABNORMAL LOW (ref 8.9–10.3)
Chloride: 103 mmol/L (ref 98–111)
Creatinine, Ser: 0.79 mg/dL (ref 0.44–1.00)
GFR calc Af Amer: >60 mL/min (ref >60)
GFR calc non Af Amer: >60 mL/min (ref >60)
Glucose, Bld: 286 mg/dL — ABNORMAL HIGH (ref 70–99)
Potassium: 4.8 mmol/L (ref 3.5–5.1)
Sodium: 139 mmol/L (ref 135–145)

## 2020-04-12 LAB — C-REACTIVE PROTEIN: CRP: 7.4 mg/dL — ABNORMAL HIGH (ref <1.0)

## 2020-04-12 LAB — GLUCOSE, CAPILLARY
Glucose-Capillary: 243 mg/dL — ABNORMAL HIGH (ref 70–99)
Glucose-Capillary: 285 mg/dL — ABNORMAL HIGH (ref 70–99)
Glucose-Capillary: 270 mg/dL — ABNORMAL HIGH (ref 70–99)
Glucose-Capillary: 250 mg/dL — ABNORMAL HIGH (ref 70–99)

## 2020-04-12 LAB — FERRITIN: Ferritin: 182 ng/mL (ref 11–307)

## 2020-04-12 MED ORDER — INSULIN GLARGINE 100 UNIT/ML ~~LOC~~ SOLN
15.0000 [IU] | Freq: Every day | SUBCUTANEOUS | Status: DC
  Administered 2020-04-12: 15 [IU] via SUBCUTANEOUS
  Filled 2020-04-12 (×2): qty 0.15

## 2020-04-12 MED ORDER — ENOXAPARIN SODIUM 80 MG/0.8ML ~~LOC~~ SOLN
75.0000 mg | SUBCUTANEOUS | Status: DC
  Administered 2020-04-12 – 2020-04-14 (×3): 75 mg via SUBCUTANEOUS
  Filled 2020-04-12 (×3): qty 0.8

## 2020-04-12 MED ORDER — MELATONIN 3 MG PO TABS
3.0000 mg | ORAL_TABLET | Freq: Every evening | ORAL | Status: DC | PRN
  Administered 2020-04-13: 3 mg via ORAL
  Filled 2020-04-12 (×2): qty 1

## 2020-04-12 MED ORDER — MELATONIN 3 MG PO TABS: 3.0000 mg | ORAL_TABLET | Freq: Every day | ORAL | Status: DC

## 2020-04-12 NOTE — Progress Notes (Signed)
Pharmacy: Lovenox Dose Adjustment for VTE prophylaxis  OBJECTIVE:  Wt: 148.6   Ht: 5'6"  BMI~52.8 SCr 0.49  CrCl~100 ml/min  ASSESSMENT:  59 YOF on lovenox for VTE prophylaxis requiring a dose adjustment for BMI>30 and CrCl>30 ml/min  PLAN:  1. Adjust Lovenox to 75 mg (~0.5 mg/kg) SQ every 24 hours 2. Pharmacy will monitor peripherally for s/sx of bleeding and any necessary dose adjustments   Thank you for allowing pharmacy to be a part of this patient's care.  Georgina Pillion, PharmD, BCPS Clinical Pharmacist Clinical phone for 04/12/2020: [phone] 04/12/2020 8:34 AM   **Pharmacist phone directory can now be found on [email or web address] ([email or web address]).  Listed under [facility].

## 2020-04-12 NOTE — Progress Notes (Signed)
Subjective:  O/N Events: None   Patient evaluated at bedside. Patient states that her breathing is about the same as yesterday, and is having difficulty sleeping which she attributes to the alarms going off throughout the hospital and her monitor. Otherwise she states that she is doing well and has no additional complaints. We discussed continued medication treatment and supplemental oxygenation. All questions and concerns were addressed.    Objective:  Vital signs in last 24 hours: Vitals:   04/11/20 1602 04/11/20 1957 04/11/20 2000 04/12/20 0456  BP: 105/64  118/65 101/64  Pulse: (!) 58  64 62  Resp: 20  (!) 22 20  Temp: 98.1 F (36.7 C) 98 F (36.7 C)  97.8 F (36.6 C)  TempSrc: Oral Oral  Oral  SpO2: 95%  92% 92%  Weight:      Height:       Physical Exam Constitutional:      General: She is not in acute distress.    Appearance: She is obese. She is not ill-appearing.     Comments: Laying in bed, able to answer questions in full sentences. AAOx3  Cardiovascular:     Rate and Rhythm: Normal rate and regular rhythm.     Heart sounds: No murmur heard.  No friction rub. No gallop.   Pulmonary:     Effort: Pulmonary effort is normal.     Breath sounds: Normal breath sounds. No decreased breath sounds, wheezing, rhonchi or rales.  Abdominal:     General: Bowel sounds are normal.     Palpations: Abdomen is soft.     Tenderness: There is no guarding.     LABS:  CBC Latest Ref Rng & Units 04/10/2020 04/10/2020 04/06/2020  WBC 4.0 - 10.5 K/uL - 8.2 -  Hemoglobin 12.0 - 15.0 g/dL 15.6(H) 14.2 15.3(H)  Hematocrit 36 - 46 % 46.0 45.4 45.0  Platelets 150 - 400 K/uL - 227 -   CMP Latest Ref Rng & Units 04/10/2020 04/10/2020 04/06/2020  Glucose 70 - 99 mg/dL 174(H) 173(H) 139(H)  BUN 6 - 20 mg/dL 15 14 16   Creatinine 0.44 - 1.00 mg/dL 0.80 0.86 0.80  Sodium 135 - 145 mmol/L 137 136 136  Potassium 3.5 - 5.1 mmol/L 3.3(L) 3.4(L) 4.1  Chloride 98 - 111 mmol/L 98 100 101  CO2 22 -  32 mmol/L - 25 -  Calcium 8.9 - 10.3 mg/dL - 8.9 -  Total Protein 6.5 - 8.1 g/dL - 6.5 -  Total Bilirubin 0.3 - 1.2 mg/dL - 0.5 -  Alkaline Phos 38 - 126 U/L - 64 -  AST 15 - 41 U/L - 18 -  ALT 0 - 44 U/L - 23 -     Assessment/Plan:  Principal Problem:   COVID-19 virus infection Active Problems:   Acute respiratory failure with hypoxia (HCC)   Type 2 diabetes mellitus with hyperglycemia (HCC)   Morbid obesity (HCC)  [name] is a 59 y/o female with a PMH of Asthma who presents to [facility] with known COVID infection, admitted for treatment of COVID 19.  Covid-19 Hx of Asthma  Diagnosed 6/15. CTA done in ED then as well and was negative for PE. Currently on 4L of supplemental O2.   - remdesivir day 3 - Decadron 6mg  QD - albuterol inhaler prn  - am ferritin, fibrinogen, crp, d-dimer - bolus additional 1/2 L NS.  - PT - robitussin prn - supplemental O2 prn  Type II Diabetes Mellitis  Likely in setting  or steroids. A1c of 7.5. - SSI-moderate - Increase Lantus 15  Prior to Admission Living Arrangement: Home Anticipated Discharge Location: Home Barriers to Discharge: Continued treatment Dispo: Anticipated discharge in approximately 2-3 day(s).   Dolan Amen, MD 04/12/2020, 5:59 AM Pager: [phone] After 5pm on weekdays and 1pm on weekends: On Call pager [phone]

## 2020-04-12 NOTE — Progress Notes (Signed)
Patient up from bed set in the chair for most of the shift, educated on flutter valve and inceptive spirometry use. Currently requiring 3L of oxygen by N/C. Oxygen saturation at  92%,  no SOB or discomfort noted. Will continue to monitor.

## 2020-04-12 NOTE — Progress Notes (Signed)
Inpatient Diabetes Program Recommendations  AACE/ADA: New Consensus Statement on Inpatient Glycemic Control (2015)  Target Ranges:  Prepandial:   less than 140 mg/dL      Peak postprandial:   less than 180 mg/dL (1-2 hours)      Critically ill patients:  140 - 180 mg/dL   Lab Results  Component Value Date   GLUCAP 285 (H) 04/12/2020   HGBA1C 7.5 (H) 04/11/2020    Review of Glycemic Control Results for LAPORSCHE, HOEGER (MRN [id number]) as of 04/12/2020 13:56  Ref. Range 04/11/2020 12:51 04/11/2020 16:53 04/11/2020 20:31 04/12/2020 07:51 04/12/2020 11:43  Glucose-Capillary Latest Ref Range: 70 - 99 mg/dL 950 (H) 932 (H) 671 (H) 243 (H) 285 (H)   Diabetes history:  DM2  Outpatient Diabetes medications:  None  Current orders for Inpatient glycemic control:  Lantus 15 units qhs  Novolog 0-20 units tid Decadron 6 mg daily  Inpatient Diabetes Program Recommendations:     Lantus 20 units qhs Novolog 2 units tid with meals if eats at least 50% of meals and CBG's > 80mg /dl  Will continue to follow while inpatient.  Thank you, , RN, BSN Diabetes Coordinator Inpatient Diabetes Program [phone] (team pager from 8a-5p)

## 2020-04-13 LAB — BASIC METABOLIC PANEL
Anion gap: 10 (ref 5–15)
BUN: 20 mg/dL (ref 6–20)
CO2: 28 mmol/L (ref 22–32)
Calcium: 8.7 mg/dL — ABNORMAL LOW (ref 8.9–10.3)
Chloride: 99 mmol/L (ref 98–111)
Creatinine, Ser: 0.75 mg/dL (ref 0.44–1.00)
GFR calc Af Amer: >60 mL/min (ref >60)
GFR calc non Af Amer: >60 mL/min (ref >60)
Glucose, Bld: 258 mg/dL — ABNORMAL HIGH (ref 70–99)
Potassium: 4.6 mmol/L (ref 3.5–5.1)
Sodium: 137 mmol/L (ref 135–145)

## 2020-04-13 LAB — CBC
HCT: 45 % (ref 36.0–46.0)
Hemoglobin: 14.2 g/dL (ref 12.0–15.0)
MCH: 27.3 pg (ref 26.0–34.0)
MCHC: 31.6 g/dL (ref 30.0–36.0)
MCV: 86.5 fL (ref 80.0–100.0)
Platelets: 286 10*3/uL (ref 150–400)
RBC: 5.2 MIL/uL — ABNORMAL HIGH (ref 3.87–5.11)
RDW: 15.9 % — ABNORMAL HIGH (ref 11.5–15.5)
WBC: 10.1 10*3/uL (ref 4.0–10.5)
nRBC: 0 % (ref 0.0–0.2)

## 2020-04-13 LAB — GLUCOSE, CAPILLARY
Glucose-Capillary: 266 mg/dL — ABNORMAL HIGH (ref 70–99)
Glucose-Capillary: 276 mg/dL — ABNORMAL HIGH (ref 70–99)
Glucose-Capillary: 186 mg/dL — ABNORMAL HIGH (ref 70–99)
Glucose-Capillary: 245 mg/dL — ABNORMAL HIGH (ref 70–99)

## 2020-04-13 LAB — C-REACTIVE PROTEIN: CRP: 2.9 mg/dL — ABNORMAL HIGH (ref <1.0)

## 2020-04-13 LAB — FERRITIN: Ferritin: 184 ng/mL (ref 11–307)

## 2020-04-13 MED ORDER — INSULIN ASPART 100 UNIT/ML ~~LOC~~ SOLN
0.0000 [IU] | Freq: Three times a day (TID) | SUBCUTANEOUS | Status: DC
  Administered 2020-04-13: 4 [IU] via SUBCUTANEOUS
  Administered 2020-04-14 (×2): 11 [IU] via SUBCUTANEOUS
  Administered 2020-04-14: 7 [IU] via SUBCUTANEOUS
  Administered 2020-04-15: 11 [IU] via SUBCUTANEOUS
  Administered 2020-04-15: 7 [IU] via SUBCUTANEOUS

## 2020-04-13 MED ORDER — INSULIN ASPART 100 UNIT/ML ~~LOC~~ SOLN
0.0000 [IU] | Freq: Every day | SUBCUTANEOUS | Status: DC
  Administered 2020-04-13: 2 [IU] via SUBCUTANEOUS
  Administered 2020-04-14: 3 [IU] via SUBCUTANEOUS

## 2020-04-13 MED ORDER — INSULIN GLARGINE 100 UNIT/ML ~~LOC~~ SOLN
15.0000 [IU] | Freq: Every day | SUBCUTANEOUS | Status: DC
  Filled 2020-04-13: qty 0.15

## 2020-04-13 MED ORDER — INSULIN GLARGINE 100 UNIT/ML ~~LOC~~ SOLN
20.0000 [IU] | Freq: Every day | SUBCUTANEOUS | Status: DC
  Administered 2020-04-13 – 2020-04-14 (×2): 20 [IU] via SUBCUTANEOUS
  Filled 2020-04-13 (×3): qty 0.2

## 2020-04-13 MED ORDER — INSULIN GLARGINE 100 UNIT/ML ~~LOC~~ SOLN
20.0000 [IU] | Freq: Every day | SUBCUTANEOUS | Status: DC
  Filled 2020-04-13: qty 0.2

## 2020-04-13 MED ORDER — INSULIN GLARGINE 100 UNIT/ML ~~LOC~~ SOLN
20.0000 [IU] | Freq: Every day | SUBCUTANEOUS | Status: DC
Start: 2020-04-13 — End: 2020-04-13

## 2020-04-13 MED ORDER — LIVING WELL WITH DIABETES BOOK
Freq: Once | Status: AC
  Filled 2020-04-13: qty 1

## 2020-04-13 NOTE — Progress Notes (Signed)
Subjective:  O/N Events: None   Patient evaluated at bedside this AM. Patient states that she is doing okay, but states she is unsure if she is doing better when she came in due to still being on oxygen. She states that she continues to use her spirometer and feels like it is helping. We discussed her oxygen use and the possibility that she may need to go home with O2. Patient voiced understanding. All questions and concerns were addressed.   Objective:  Vital signs in last 24 hours: Vitals:   04/12/20 1421 04/12/20 2123 04/13/20 0042 04/13/20 0556  BP: 132/79 104/63  93/62  Pulse: 64 60 (!) 53 66  Resp: (!) 23 20 13 20   Temp: 97.7 F (36.5 C) 97.8 F (36.6 C)  98 F (36.7 C)  TempSrc: Oral Oral  Oral  SpO2: 92% 90% (!) 88% 90%  Weight:      Height:       Physical Exam Constitutional:      General: She is not in acute distress.    Appearance: She is obese. She is not ill-appearing, toxic-appearing or diaphoretic.  Cardiovascular:     Rate and Rhythm: Normal rate and regular rhythm.     Heart sounds: No murmur heard.  No friction rub. No gallop.   Pulmonary:     Effort: Pulmonary effort is normal. No tachypnea, bradypnea or accessory muscle usage.     Breath sounds: Normal breath sounds. No decreased breath sounds, wheezing, rhonchi or rales.  Abdominal:     General: Bowel sounds are normal.     Palpations: Abdomen is soft.  Neurological:     Mental Status: She is alert.     LABS:  CBC Latest Ref Rng & Units 04/13/2020 04/12/2020 04/10/2020  WBC 4.0 - 10.5 K/uL 10.1 7.1 -  Hemoglobin 12.0 - 15.0 g/dL 04/12/2020 82.9 15.6(H)  Hematocrit 36 - 46 % 45.0 46.2(H) 46.0  Platelets 150 - 400 K/uL 286 247 -   CMP Latest Ref Rng & Units 04/13/2020 04/12/2020 04/10/2020  Glucose 70 - 99 mg/dL 04/12/2020) 169(C) 789(F)  BUN 6 - 20 mg/dL 20 16 15   Creatinine 0.44 - 1.00 mg/dL 810(F 7.51  Sodium 135 - 145 mmol/L 137 139 137  Potassium 3.5 - 5.1 mmol/L 4.6 4.8 3.3(L)  Chloride 98 - 111  mmol/L 99 103 98  CO2 22 - 32 mmol/L 28 28 -  Calcium 8.9 - 10.3 mg/dL 0.25) 8.52) -  Total Protein 6.5 - 8.1 g/dL - - -  Total Bilirubin 0.3 - 1.2 mg/dL - - -  Alkaline Phos 38 - 126 U/L - - -  AST 15 - 41 U/L - - -  ALT 0 - 44 U/L - - -     Assessment/Plan:  Principal Problem:   COVID-19 virus infection Active Problems:   Acute respiratory failure with hypoxia (HCC)   Type 2 diabetes mellitus with hyperglycemia (HCC)   Morbid obesity (HCC)  [name] is a 59 y/o female with a PMH of Asthma who presents to [facility] with known COVID infection, admitted for treatment of COVID 19.  Covid-19 Hx of Asthma  Diagnosed 6/15. CTA done in ED then as well and was negative for PE. Was able to wean down to 3L last night, but had a bradycardic event (53) with O2 saturation of 88% and was increased to 5L HFNC. - remdesivir day 4 - Decadron 6mg  QD - albuterol inhaler prn  - ferritin: 184 -  C-RP: 2.9 from 7.4  - bolus additional 1/2 L NS.  - PT  - robitussin prn - supplemental O2 prn  Type II Diabetes Mellitis  Likely in setting or steroids. A1c of 7.5.  - SSI-resistant - Increase Lantus 20 - HS coverage - When appropriate for D/C will DC with metformin   Prior to Admission Living Arrangement: Home Anticipated Discharge Location: Home Barriers to Discharge: Continued treatment Dispo: Anticipated discharge in approximately 2-3 day(s).   [name], MD 04/13/2020, 6:39 AM Pager: [phone] After 5pm on weekdays and 1pm on weekends: On Call pager [phone]

## 2020-04-13 NOTE — Progress Notes (Signed)
Inpatient Diabetes Program Recommendations  AACE/ADA: New Consensus Statement on Inpatient Glycemic Control (2015)  Target Ranges:  Prepandial:   less than 140 mg/dL      Peak postprandial:   less than 180 mg/dL (1-2 hours)      Critically ill patients:  140 - 180 mg/dL   Lab Results  Component Value Date   GLUCAP 250 (H) 04/12/2020   HGBA1C 7.5 (H) 04/11/2020    Review of Glycemic Control Results for SHAREE, STURDY (MRN [id number]) as of 04/13/2020 07:56  Ref. Range 04/11/2020 20:31 04/12/2020 07:51 04/12/2020 11:43 04/12/2020 15:46 04/12/2020 21:22  Glucose-Capillary Latest Ref Range: 70 - 99 mg/dL 975 (H) 883 (H) 254 (H) 270 (H) 250 (H)   Diabetes history:  New onset DM2  Outpatient Diabetes medications:  None  Current orders for Inpatient glycemic control:  Decadron 6 mg daily Novolog 0-20 units tid Lantus 15 units qhs    Inpatient Diabetes Program Recommendations:     Lantus 20 units qhs Novolog 0-5 units qhs Novolog 2 units tid with meals if cbg >80mg /dl   Note:  Spoke with patient over the phone as she is positive for COVID.  Spoke with pt about new diagnosis. Discussed A1C results with them and explained what an A1C is, basic pathophysiology of DM Type 2, basic home care, basic diabetes diet nutrition principles, importance of checking CBGs and maintaining good CBG control to prevent long-term and short-term complications. Reviewed signs and symptoms of hyperglycemia and hypoglycemia and how to treat hypoglycemia at home. Also reviewed blood sugar goals at home.   RNs to provide ongoing basic DM education at bedside with this patient. Have ordered educational booklet and DM videos. Have also placed RD consult for DM diet education for this patient. Attached education to AVS.    Patient does not have insurance or a PCP.  TOC consult placed for assistance with PCP and no insurance.    Educated patient at length about cutting out sugary drinks, CHO's and which foods  contain CHO's.  Importance of limiting CHO's 50-60 grams per meal, The Plate Method and  importance of exercise.    Patient will benefit from discharging home on Metformin 500 mg bid.  Spoke with patient about Metformin, cost and side effects.  She states she can afford $4 at [facility].  Will provide patient with a glucometer.  Told her to check CBG's fasting and mid afternoon and take meter to follow up appointment for review.    Will continue to follow while inpatient.  Thank you, Dulce Sellar, RN, BSN Diabetes Coordinator Inpatient Diabetes Program [phone] (team pager from 8a-5p)

## 2020-04-13 NOTE — Discharge Instructions (Addendum)
Cynthia Meyer,  It was a pleasure working with you during your stay at [facility]. You were admitted for COVID 19 Pneumonia. During your stay, you completed a full course of remdesivir, and were placed on a steroid to reduce your lung inflammation. You will be discharged home with supplemental oxygen. Additionally, you were found to have diabetes on admission. We will discharge you with Metformin, which you should take to times a day. Please go to the nearest emergency department for reevaluation if you begin to have difficulty breathing, spike temperatures, or notice an abrupt change in your mentation.     Carbohydrate Counting For People With Diabetes  Foods with carbohydrates make your blood glucose level go up. Learning how to count carbohydrates can help you control your blood glucose levels. First, identify the foods you eat that contain carbohydrates. Then, using the Foods with Carbohydrates chart, determine about how much carbohydrates are in your meals and snacks. Make sure you are eating foods with fiber, protein, and healthy fat along with your carbohydrate foods. Foods with Carbohydrates The following table shows carbohydrate foods that have about 15 grams of carbohydrate each. Using measuring cups, spoons, or a food scale when you first begin learning about carbohydrate counting can help you learn about the portion sizes you typically eat. The following foods have 15 grams carbohydrate each:  Grains . 1 slice bread (1 ounce)  . 1 small tortilla (6-inch size)  .  large bagel (1 ounce)  . 1/3 cup pasta or rice (cooked)  .  hamburger or hot dog bun ( ounce)  .  cup cooked cereal  .  to  cup ready-to-eat cereal  . 2 taco shells (5-inch size) Fruit . 1 small fresh fruit ( to 1 cup)  .  medium banana  . 17 small grapes (3 ounces)  . 1 cup melon or berries  .  cup canned or frozen fruit  . 2 tablespoons dried fruit (blueberries, cherries, cranberries, raisins)  .  cup unsweetened  fruit juice  Starchy Vegetables .  cup cooked beans, peas, corn, potatoes/sweet potatoes  .  large baked potato (3 ounces)  . 1 cup acorn or butternut squash  Snack Foods . 3 to 6 crackers  . 8 potato chips or 13 tortilla chips ( ounce to 1 ounce)  . 3 cups popped popcorn  Dairy . 3/4 cup (6 ounces) nonfat plain yogurt, or yogurt with sugar-free sweetener  . 1 cup milk  . 1 cup plain rice, soy, coconut or flavored almond milk Sweets and Desserts .  cup ice cream or frozen yogurt  . 1 tablespoon jam, jelly, pancake syrup, table sugar, or honey  . 2 tablespoons light pancake syrup  . 1 inch square of frosted cake or 2 inch square of unfrosted cake  . 2 small cookies (2/3 ounce each) or  large cookie  Sometimes you'll have to estimate carbohydrate amounts if you don't know the exact recipe. One cup of mixed foods like soups can have 1 to 2 carbohydrate servings, while some casseroles might have 2 or more servings of carbohydrate. Foods that have less than 20 calories in each serving can be counted as "free" foods. Count 1 cup raw vegetables, or  cup cooked non-starchy vegetables as "free" foods. If you eat 3 or more servings at one meal, then count them as 1 carbohydrate serving.  Foods without Carbohydrates  Not all foods contain carbohydrates. Meat, some dairy, fats, non-starchy vegetables, and many beverages don't contain  carbohydrate. So when you count carbohydrates, you can generally exclude chicken, pork, beef, fish, seafood, eggs, tofu, cheese, butter, sour cream, avocado, nuts, seeds, olives, mayonnaise, water, black coffee, unsweetened tea, and zero-calorie drinks. Vegetables with no or low carbohydrate include green beans, cauliflower, tomatoes, and onions. How much carbohydrate should I eat at each meal?  Carbohydrate counting can help you plan your meals and manage your weight. Following are some starting points for carbohydrate intake at each meal. Work with your registered  dietitian nutritionist to find the best range that works for your blood glucose and weight.   To Lose Weight To Maintain Weight  Women 2 - 3 carb servings 3 - 4 carb servings  Men 3 - 4 carb servings 4 - 5 carb servings  Checking your blood glucose after meals will help you know if you need to adjust the timing, type, or number of carbohydrate servings in your meal plan. Achieve and keep a healthy body weight by balancing your food intake and physical activity.  Tips How should I plan my meals?  Plan for half the food on your plate to include non-starchy vegetables, like salad greens, broccoli, or carrots. Try to eat 3 to 5 servings of non-starchy vegetables every day. Have a protein food at each meal. Protein foods include chicken, fish, meat, eggs, or beans (note that beans contain carbohydrate). These two food groups (non-starchy vegetables and proteins) are low in carbohydrate. If you fill up your plate with these foods, you will eat less carbohydrate but still fill up your stomach. Try to limit your carbohydrate portion to  of the plate.  What fats are healthiest to eat?  Diabetes increases risk for heart disease. To help protect your heart, eat more healthy fats, such as olive oil, nuts, and avocado. Eat less saturated fats like butter, cream, and high-fat meats, like bacon and sausage. Avoid trans fats, which are in all foods that list "partially hydrogenated oil" as an ingredient. What should I drink?  Choose drinks that are not sweetened with sugar. The healthiest choices are water, carbonated or seltzer waters, and tea and coffee without added sugars.  Sweet drinks will make your blood glucose go up very quickly. One serving of soda or energy drink is  cup. It is best to drink these beverages only if your blood glucose is low.  Artificially sweetened, or diet drinks, typically do not increase your blood glucose if they have zero calories in them. Read labels of beverages, as some diet  drinks do have carbohydrate and will raise your blood glucose. Label Reading Tips Read Nutrition Facts labels to find out how many grams of carbohydrate are in a food you want to eat. Don't forget: sometimes serving sizes on the label aren't the same as how much food you are going to eat, so you may need to calculate how much carbohydrate is in the food you are serving yourself.   Carbohydrate Counting for People with Diabetes Sample 1-Day Menu  Breakfast  cup yogurt, low fat, low sugar (1 carbohydrate serving)   cup cereal, ready-to-eat, unsweetened (1 carbohydrate serving)  1 cup strawberries (1 carbohydrate serving)   cup almonds ( carbohydrate serving)  Lunch 1, 5 ounce can chunk light tuna  2 ounces cheese, low fat cheddar  6 whole wheat crackers (1 carbohydrate serving)  1 small apple (1 carbohydrate servings)   cup carrots ( carbohydrate serving)   cup snap peas  1 cup 1% milk (1 carbohydrate serving)  Evening Meal Stir fry made with: 3 ounces chicken  1 cup brown rice (3 carbohydrate servings)   cup broccoli ( carbohydrate serving)   cup green beans   cup onions  1 tablespoon olive oil  2 tablespoons teriyaki sauce ( carbohydrate serving)  Evening Snack 1 extra small banana (1 carbohydrate serving)  1 tablespoon peanut butter   Carbohydrate Counting for People with Diabetes Vegan Sample 1-Day Menu  Breakfast 1 cup cooked oatmeal (2 carbohydrate servings)   cup blueberries (1 carbohydrate serving)  2 tablespoons flaxseeds  1 cup soymilk fortified with calcium and vitamin D  1 cup coffee  Lunch 2 slices whole wheat bread (2 carbohydrate servings)   cup baked tofu   cup lettuce  2 slices tomato  2 slices avocado   cup baby carrots ( carbohydrate serving)  1 orange (1 carbohydrate serving)  1 cup soymilk fortified with calcium and vitamin D   Evening Meal Burrito made with: 1 6-inch corn tortilla (1 carbohydrate serving)  1 cup refried vegetarian beans  (2 carbohydrate servings)   cup chopped tomatoes   cup lettuce   cup salsa  1/3 cup brown rice (1 carbohydrate serving)  1 tablespoon olive oil for rice   cup zucchini   Evening Snack 6 small whole grain crackers (1 carbohydrate serving)  2 apricots ( carbohydrate serving)   cup unsalted peanuts ( carbohydrate serving)    Carbohydrate Counting for People with Diabetes Vegetarian (Lacto-Ovo) Sample 1-Day Menu  Breakfast 1 cup cooked oatmeal (2 carbohydrate servings)   cup blueberries (1 carbohydrate serving)  2 tablespoons flaxseeds  1 egg  1 cup 1% milk (1 carbohydrate serving)  1 cup coffee  Lunch 2 slices whole wheat bread (2 carbohydrate servings)  2 ounces low-fat cheese   cup lettuce  2 slices tomato  2 slices avocado   cup baby carrots ( carbohydrate serving)  1 orange (1 carbohydrate serving)  1 cup unsweetened tea  Evening Meal Burrito made with: 1 6-inch corn tortilla (1 carbohydrate serving)   cup refried vegetarian beans (1 carbohydrate serving)   cup tomatoes   cup lettuce   cup salsa  1/3 cup brown rice (1 carbohydrate serving)  1 tablespoon olive oil for rice   cup zucchini  1 cup 1% milk (1 carbohydrate serving)  Evening Snack 6 small whole grain crackers (1 carbohydrate serving)  2 apricots ( carbohydrate serving)   cup unsalted peanuts ( carbohydrate serving)    Copyright 2020  Academy of Nutrition and Dietetics. All rights reserved.  Using Nutrition Labels: Carbohydrate  . Serving Size  . Look at the serving size. All the information on the label is based on this portion. Jolyne Loa Per Container  . The number of servings contained in the package. . Guidelines for Carbohydrate  . Look at the total grams of carbohydrate in the serving size.  . 1 carbohydrate choice = 15 grams of carbohydrate. Range of Carbohydrate Grams Per Choice  Carbohydrate Grams/Choice Carbohydrate Choices  6-10   11-20 1  21-25 1  26-35 2  36-40 2    41-50 3  51-55 3  56-65 4  66-70 4  71-80 5    Copyright 2020  Academy of Nutrition and Dietetics. All rights reserved.

## 2020-04-13 NOTE — Progress Notes (Signed)
Brief Nutrition Education Note   RD consulted for nutrition education regarding diabetes.   Lab Results  Component Value Date   HGBA1C 7.5 (H) 04/11/2020   PTA DM medications are none.   Labs reviewed: CBGS: 250-270 (inpatient orders for glycemic control are 0-20 units insulin aspart TID with meals, 0-5 units insulin aspart q HS, and 20 units insulin glargine daily at bedtime).   Per DM coordinator note, pt is uninsured and with no PCP. Plan to arrange PCP and discharge home on 500 mg metformin BID.   Attempted to speak with pt via phone x 2, however, pt terminated call with RD speaking to her on both attempts.   RD provided "Carbohydrate Counting for People with Diabetes" handout from the Academy of Nutrition and Dietetics. Attached handout to AVS/ discharge instructions.   Body mass index is 52.88 kg/m. Pt meets criteria for morbid obesity based on current BMI. Obesity is a complex, chronic medical condition that is optimally managed by a multidisciplinary care team. Weight loss is not an ideal goal for an acute inpatient hospitalization. However, if further work-up for obesity is warranted, consider outpatient referral to outpatient bariatric service and/or [facility].   Current diet order is carb modified, patient is consuming approximately 80-100% of meals at this time. Labs and medications reviewed. No further nutrition interventions warranted at this time. RD contact information provided. If additional nutrition issues arise, please re-consult RD.  Levada Schilling, RD, LDN, CDCES Registered Dietitian II Certified Diabetes Care and Education Specialist Please refer to [facility] for RD and/or RD on-call/weekend/after hours pager

## 2020-04-13 NOTE — Progress Notes (Signed)
Physical Therapy Treatment Patient Details Name: Cynthia Meyer MRN: [id number] DOB: 1961/04/02 Today's Date: 04/13/2020    History of Present Illness Pt is a 59 y.o. female admitted 04/10/20 with worsening SOB and hypoxia after recent dx of COVID-19. Pt also noted to be hyperglycemic. PMH includes obesity, asthma.    PT Comments    Limited session as pt had just completed giving herself a bath while standing at the sink. Monitors not yet re-attached and pt with +dyspnea with speaking. Monitors attached with sats 86% (on 5L)  RR 27 HR 72. Session then focused on educating on use of pursed lip breathing to slow RR and increase oxygen. Educated on how to use chair or BSC at sink to allow seated rest breaks (or completing appropriate portions of bath in sitting--also shared this recommendation with nursing). After further education (see below), pt reporting she still felt too exhausted to attempt walking at this time. Will attempt to see later today as schedule permits.     Follow Up Recommendations  No PT follow up;Supervision - Intermittent     Equipment Recommendations  None recommended by PT    Recommendations for Other Services       Precautions / Restrictions Precautions Precautions: Fall;Other (comment) Precaution Comments: Watch SpO2 Restrictions Weight Bearing Restrictions: No    Mobility  Bed Mobility                  Transfers                    Ambulation/Gait                 Stairs             Wheelchair Mobility    Modified Rankin (Stroke Patients Only)       Balance                                            Cognition Arousal/Alertness: Awake/alert Behavior During Therapy: WFL for tasks assessed/performed Overall Cognitive Status: Within Functional Limits for tasks assessed                                        Exercises Other Exercises Other Exercises: see comments re: use of IS  and flutter valve Other Exercises: Pt reports doing AROM exercises as instructed throughout the day.     General Comments General comments (skin integrity, edema, etc.): Patient had just completed bath with nurse tech on arrival. Patient in recliner with monitors unattached with dyspnea with speaking. Monitors applied with sats 86% RR 27 HR 72. Pt on 5L [place] with 3 oxygen extension tubes. Re-educated on pursed lip breathing to help pt slow her breathing and pt with tendency to exhale without pursing her lips. Educated on importance of this technique in slowing her breathing and increasing her oxygen. Patient then able to return demonstrate correct technique. Patient increased to 92% with RR 19. Pt reports she did her entire bath while standing at the sink. Educated on performing some parts of bath in sitting to reduce exhausted feeling she is currently describing. Patient able to verbalize how often to do her IS and flutter valve (and verbalized correct order to do these). She verbalized that she has been walking around bed  to recliner herself to maintain her activity. She states she is aware if alarm rings that she needs to stop and use pursed lip breathing to increase her oxygen. After education and rest period, pt reporting she still felt too tired to attempt ambulation at this time.       Pertinent Vitals/Pain Pain Assessment: No/denies pain    Home Living                      Prior Function            PT Goals (current goals can now be found in the care plan section) Acute Rehab PT Goals Patient Stated Goal: Get back home and back to work Time For Goal Achievement: 04/25/20 Potential to Achieve Goals: Good Progress towards PT goals: Not progressing toward goals - comment (fatigued after standing for bath; ambulation deferred)    Frequency    Min 3X/week      PT Plan Current plan remains appropriate    Co-evaluation              AM-PAC PT "6 Clicks" Mobility    Outcome Measure  Help needed turning from your back to your side while in a flat bed without using bedrails?: None Help needed moving from lying on your back to sitting on the side of a flat bed without using bedrails?: None Help needed moving to and from a bed to a chair (including a wheelchair)?: None Help needed standing up from a chair using your arms (e.g., wheelchair or bedside chair)?: None Help needed to walk in hospital room?: None Help needed climbing 3-5 steps with a railing? : A Little 6 Click Score: 23    End of Session Equipment Utilized During Treatment: Oxygen Activity Tolerance: Patient limited by fatigue Patient left: with call bell/phone within reach;in chair Nurse Communication: Other (comment) (pt very fatigued from standing bath) PT Visit Diagnosis: Other abnormalities of gait and mobility (R26.89)     Time: 4401-0272 PT Time Calculation (min) (ACUTE ONLY): 15 min  Charges:  $Self Care/Home Management: 8-22                      Jerolyn Center, PT Pager [phone]    Zena Amos 04/13/2020, 12:33 PM

## 2020-04-13 NOTE — Progress Notes (Signed)
   04/13/20 0042  Vitals  Pulse Rate (!) 53  ECG Heart Rate (!) 53  Resp 13  Level of Consciousness  Level of Consciousness Alert  Oxygen Therapy  SpO2 (!) 88 %  O2 Device HFNC  O2 Flow Rate (L/min) 5 L/min  Pulse Oximetry Type Continuous  Pain Assessment  Pain Scale 0-10  Pain Score 0  PCA/Epidural/Spinal Assessment  Respiratory Pattern Regular;Labored;Dyspnea with exertion  MEWS Score  MEWS Temp 0  MEWS Systolic 0  MEWS Pulse 0  MEWS RR 1  MEWS LOC 0  MEWS Score 1  MEWS Score Color Green  Increased patient's oxygen from 3L to 5L as patient was not able to maintain O2 sat above 88 with labored breathing. Will continue to monitor and educate pt. Margarette Asal, RN

## 2020-04-14 LAB — GLUCOSE, CAPILLARY
Glucose-Capillary: 234 mg/dL — ABNORMAL HIGH (ref 70–99)
Glucose-Capillary: 283 mg/dL — ABNORMAL HIGH (ref 70–99)
Glucose-Capillary: 272 mg/dL — ABNORMAL HIGH (ref 70–99)
Glucose-Capillary: 281 mg/dL — ABNORMAL HIGH (ref 70–99)

## 2020-04-14 LAB — FERRITIN: Ferritin: 167 ng/mL (ref 11–307)

## 2020-04-14 LAB — C-REACTIVE PROTEIN: CRP: 1.2 mg/dL — ABNORMAL HIGH (ref <1.0)

## 2020-04-14 NOTE — Plan of Care (Signed)
  Problem: Clinical Measurements: Goal: Respiratory complications will improve Outcome: Progressing   

## 2020-04-14 NOTE — Progress Notes (Signed)
SATURATION QUALIFICATIONS: (This note is used to comply with regulatory documentation for home oxygen)  Patient Saturations on Room Air at Rest = 85%  Patient Saturations on Room Air while Ambulating = 83%  Patient Saturations on 4 Liters of oxygen while Ambulating = 90%  Please briefly explain why patient needs home oxygen: Patient's oxygen on room air at rest is 85%.  Patient started walking and desatted to 83% on room air.  4 L nasal cannula applied to patient while ambulating.  Patient's oxygen level was 90% during ambulation.  Patient returned to the bed. Patient's oxygen level decreased to 3 L Nasal Cannula.  Patient's oxygen level post ambulation was 91%.

## 2020-04-14 NOTE — Progress Notes (Signed)
Subjective:  O/N Events: None   Cynthia Meyer was seen at bedside this AM. Patient states she is doing well this morning. No complaints of headaches, SHOB, nausea, vomiting, or abdominal pain. She states that she has been going to approximately 3L of oxygen and feels comfortable. She does endorse occasional shortness of breath with ambulation. We discussed oxygen therapy to go home with and to establish care with a PCP for follow up.   Objective:  Vital signs in last 24 hours: Vitals:   04/13/20 0556 04/13/20 1207 04/13/20 2026 04/14/20 0433  BP: 93/62 110/63 115/69 120/66  Pulse: 66 63 60 63  Resp: 20 16 20 18   Temp: 98 F (36.7 C) 98.2 F (36.8 C) 97.7 F (36.5 C) 97.7 F (36.5 C)  TempSrc: Oral Oral Oral Oral  SpO2: 90% 95% 96% 97%  Weight:      Height:       Physical Exam Constitutional:      General: She is not in acute distress.    Appearance: She is obese. She is not ill-appearing or toxic-appearing.  Cardiovascular:     Rate and Rhythm: Normal rate and regular rhythm.     Heart sounds: No murmur heard.  No friction rub. No gallop.   Pulmonary:     Effort: Pulmonary effort is normal.     Breath sounds: Normal breath sounds. No decreased breath sounds, wheezing, rhonchi or rales.  Abdominal:     General: Bowel sounds are normal.     Palpations: Abdomen is soft. There is no mass.     Tenderness: There is no abdominal tenderness. There is no guarding.  Neurological:     Mental Status: She is alert.     LABS:  CBC Latest Ref Rng & Units 04/13/2020 04/12/2020 04/10/2020  WBC 4.0 - 10.5 K/uL 10.1 7.1 -  Hemoglobin 12.0 - 15.0 g/dL 04/12/2020 26.7 15.6(H)  Hematocrit 36 - 46 % 45.0 46.2(H) 46.0  Platelets 150 - 400 K/uL 286 247 -   CMP Latest Ref Rng & Units 04/13/2020 04/12/2020 04/10/2020  Glucose 70 - 99 mg/dL 04/12/2020) 580(D) 983(J)  BUN 6 - 20 mg/dL 20 16 15   Creatinine 0.44 - 1.00 mg/dL 825(K 5.39  Sodium 135 - 145 mmol/L 137 139 137  Potassium 3.5 - 5.1 mmol/L 4.6  4.8 3.3(L)  Chloride 98 - 111 mmol/L 99 103 98  CO2 22 - 32 mmol/L 28 28 -  Calcium 8.9 - 10.3 mg/dL 7.67) 3.41) -  Total Protein 6.5 - 8.1 g/dL - - -  Total Bilirubin 0.3 - 1.2 mg/dL - - -  Alkaline Phos 38 - 126 U/L - - -  AST 15 - 41 U/L - - -  ALT 0 - 44 U/L - - -     Assessment/Plan:  Principal Problem:   COVID-19 virus infection Active Problems:   Acute respiratory failure with hypoxia (HCC)   Type 2 diabetes mellitus with hyperglycemia (HCC)   Morbid obesity (HCC)  Cynthia Meyer is a 59 y/o female with a PMH of Asthma who presents to [facility] with known COVID infection, admitted for treatment of COVID 19.  Covid-19 Hx of Asthma  Diagnosed 6/15. CTA done in ED then as well and was negative for PE. 3-4 Liters O/N on [place] - remdesivir day 5 - Decadron 6mg  QD - albuterol inhaler prn  - ferritin: 167 - C-RP: 1.2  - bolus additional 1/2 L NS.  - PT  - robitussin prn -  supplemental O2 prn - ambulate every shift  Type II Diabetes Mellitis  Likely in setting or steroids. A1c of 7.5.  - SSI-resistant - Increase Lantus 20 - HS coverage - When appropriate for D/C will DC with metformin   Prior to Admission Living Arrangement: Home Anticipated Discharge Location: Home Barriers to Discharge: Continued treatment Dispo: Anticipated discharge in approximately 0-1 day(s).   [name], MD 04/14/2020, 6:25 AM Pager: [phone] After 5pm on weekdays and 1pm on weekends: On Call pager [phone]

## 2020-04-15 LAB — CBC
HCT: 49.5 % — ABNORMAL HIGH (ref 36.0–46.0)
Hemoglobin: 15.4 g/dL — ABNORMAL HIGH (ref 12.0–15.0)
MCH: 26.6 pg (ref 26.0–34.0)
MCHC: 31.1 g/dL (ref 30.0–36.0)
MCV: 85.5 fL (ref 80.0–100.0)
Platelets: 380 10*3/uL (ref 150–400)
RBC: 5.79 MIL/uL — ABNORMAL HIGH (ref 3.87–5.11)
RDW: 15.7 % — ABNORMAL HIGH (ref 11.5–15.5)
WBC: 11.1 10*3/uL — ABNORMAL HIGH (ref 4.0–10.5)
nRBC: 0 % (ref 0.0–0.2)

## 2020-04-15 LAB — CULTURE, BLOOD (ROUTINE X 2)
Culture: NO GROWTH
Culture: NO GROWTH

## 2020-04-15 LAB — FERRITIN: Ferritin: 177 ng/mL (ref 11–307)

## 2020-04-15 LAB — C-REACTIVE PROTEIN: CRP: 0.6 mg/dL (ref <1.0)

## 2020-04-15 LAB — GLUCOSE, CAPILLARY
Glucose-Capillary: 229 mg/dL — ABNORMAL HIGH (ref 70–99)
Glucose-Capillary: 268 mg/dL — ABNORMAL HIGH (ref 70–99)

## 2020-04-15 MED ORDER — METFORMIN HCL 500 MG PO TABS: 500.0000 mg | ORAL_TABLET | Freq: Two times a day (BID) | ORAL | 0 refills | Status: AC

## 2020-04-15 MED FILL — metFORMIN HCL 500 MG TABS: 500 | 30 days supply | Qty: 60 | Fill #0

## 2020-04-15 NOTE — TOC Transition Note (Signed)
Transition of Care [facility]) - CM/SW Discharge Note   Patient Details  Name: Cynthia Meyer MRN: [id number] Date of Birth: 01-Apr-1961  Transition of Care [facility]) CM/SW Contact:  Kermit Balo, RN Phone Number: 04/15/2020, 12:42 PM   Clinical Narrative:    Pt discharging home with self care. No f/u per PT.  Pt going home on oxygen provided through [employer] charity. Home medications to be delivered to the room per [employer].  Pt has transportation home.    Final next level of care: Home/Self Care Barriers to Discharge: Inadequate or no insurance, Barriers Unresolved (comment)   Patient Goals and CMS Choice     Choice offered to / list presented to : Patient  Discharge Placement                       Discharge Plan and Services   Discharge Planning Services: CM Consult Post Acute Care Choice: Durable Medical Equipment          DME Arranged: Oxygen DME Agency: [employer] Date DME Agency Contacted: 04/14/20   Representative spoke with at DME Agency: Zack            Social Determinants of Health (SDOH) Interventions     Readmission Risk Interventions No flowsheet data found.

## 2020-04-15 NOTE — Progress Notes (Signed)
Cynthia Meyer to be D/C'd Home per MD order.  Discussed with the patient and all questions fully answered.  VSS, Skin clean, dry and intact without evidence of skin break down, no evidence of skin tears noted. IV catheter discontinued intact. Site without signs and symptoms of complications. Dressing and pressure applied.  An After Visit Summary was printed and given to the patient. Patient received prescription.  D/c education completed with patient/family including follow up instructions, medication list, d/c activities limitations if indicated, with other d/c instructions as indicated by MD - patient able to verbalize understanding, all questions fully answered.   Patient instructed to return to ED, call 911, or call MD for any changes in condition.   Patient escorted via WC, and D/C home via private auto.  Velva Harman 04/15/2020 11:52 AM

## 2020-04-15 NOTE — Discharge Summary (Signed)
Name: Cynthia Meyer MRN: [id number] DOB: 12-24-1960 59 y.o. PCP: Patient, No Pcp Per  Date of Admission: 04/10/2020  2:22 PM Date of Discharge: 04/15/2020 Attending Physician: Earl Lagos, MD  Discharge Diagnosis: 1. COVID 19 Pneumonia:  2. New Diagnosis of Type II Diabetes Melitis   Discharge Medications: Allergies as of 04/15/2020   No Known Allergies     Medication List    STOP taking these medications   levofloxacin 750 MG tablet Commonly known as: Levaquin     TAKE these medications   albuterol 108 (90 Base) MCG/ACT inhaler Commonly known as: VENTOLIN HFA Inhale 2 puffs into the lungs every 6 (six) hours as needed for wheezing or shortness of breath.   aspirin-acetaminophen-caffeine 250-250-65 MG tablet Commonly known as: EXCEDRIN MIGRAINE Take 2 tablets by mouth every 8 (eight) hours as needed for headache or migraine. For migraines.   ibuprofen 200 MG tablet Commonly known as: ADVIL Take 400 mg by mouth every 6 (six) hours as needed for fever (pain).   metFORMIN 500 MG tablet Commonly known as: Glucophage Take 1 tablet (500 mg total) by mouth 2 (two) times daily with a meal.   predniSONE 20 MG tablet Commonly known as: DELTASONE Take 60 mg daily x 2 days then 40 mg daily x 2 days then 20 mg daily x 2 days What changed:   how much to take  how to take this  when to take this  additional instructions   vitamin C 1000 MG tablet Take 1,000 mg by mouth daily.            Durable Medical Equipment  (From admission, onward)         Start     Ordered   04/14/20 1136  For home use only DME oxygen  Once       Question Answer Comment  Length of Need 6 Months   Mode or (Route) Nasal cannula   Liters per Minute 4   Frequency Continuous (stationary and portable oxygen unit needed)   Oxygen conserving device Yes   Oxygen delivery system Gas      04/14/20 1135          Disposition and follow-up:   Cynthia Meyer was discharged from  [facility] in Stable condition.  At the hospital follow up visit please address:  1.  COVID 19 Pneumonia:   - Assess for continued supplemental O2 use.  2. Diabetes Type 2:   - Check CBG  - Compliant with Metformin  - Continue Metformin  2.  Labs / imaging needed at time of follow-up: CBG  3.  Pending labs/ test needing follow-up: None  Follow-up Appointments:  Follow-up Information    [facility] covid [facility] Follow up on 04/27/2020.   Why: Your appt is at 11 am. Please arrive early.              Hospital Course by problem list: 1. COVID 19 Pneumonia:  Cynthia Meyer was previously seen in the ED on 04/06/2020 and was diagnosed with COVID 19. She presented on 04/10/2020 for COVID 19 Pneumonia with shortness of breath, tachypnea, tachycardia, and supplemental oxygen requirement of 7L of oxygen. She was started on remdesivir, dexamethasone, albuterol, and robitussin. She completed a 5 day course of remdesivir, and was weaned down to 3-4L of supplemental oxygen. She was discharged home in stable condition, with Home O2, and was established with a new PCP.   2. Newly Diagnosed Type II  Diabetes Mellitis:  Patient was admitted for COVID 19 pneumonia, and found to have a DMT2 with a A1c of 7.5. Patient managed on sliding scale insulin, and Lantus. Patient was discharged in stable condition on metformin and instructed to follow up with her new PCP.   Discharge Vitals:   , BP 110/77 (BP Location: Right Arm)   Pulse 69   Temp 97.7 F (36.5 C) (Oral)   Resp (!) 21   Ht 5\' 6"  (1.676 m)   Wt (!) 148.6 kg   SpO2 (!) 89%   BMI 52.88 kg/m   Pertinent Labs, Studies, and Procedures:  Ref Range & Units 5 d ago   Hgb A1c MFr Bld 4.8 - 5.6 % 7.5High   Comment: (NOTE)  Pre diabetes:     5.7%-6.4%   Diabetes:       >6.4%   Glycemic control for  <7.0%  adults with diabetes    Ref Range & Units 10 d ago   SARS Coronavirus 2 NEGATIVE POSITIVEAbnormal       Ref Range & Units 1 d ago 2 d ago 3 d ago 4 d ago  Ferritin 11 - 307 ng/mL 177  167 CM  184 CM  182     Ref Range & Units 1 d ago 2 d ago 3 d ago 4 d ago  CRP <1.0 mg/dL 0.6  1.2High CM  2.9High CM  7.4High          Discharge Instructions: Discharge Instructions    Call MD for:  difficulty breathing, headache or visual disturbances   Complete by: As directed    Call MD for:  extreme fatigue   Complete by: As directed    Call MD for:  persistant dizziness or light-headedness   Complete by: As directed    Diet - low sodium heart healthy   Complete by: As directed    Increase activity slowly   Complete by: As directed       Signed: [name], MD 04/15/2020, 12:37 PM   Pager: [phone]

## 2020-04-15 NOTE — Progress Notes (Signed)
Subjective:  O/N Events: None   Cynthia Meyer was seen and evaluated today at bedside. Patient states that Cynthia Meyer doing well today. Cynthia denies nausea, chest pain, abdominal pain, or changes in bowel habits. Patient states that Cynthia did have some chest pain on Saturday, but that has alleviated since admission. We discussed discharge home today. [name] agreeable, and states that Cynthia has been set up with a PCP and supplemental O2 yesterday. All questions and concerns were addressed.   Objective:  Vital signs in last 24 hours: Vitals:   04/14/20 0752 04/14/20 1704 04/14/20 2020 04/15/20 0504  BP: 129/77 (!) 124/92 119/67 116/72  Pulse: (!) 58 73 69 62  Resp: 14 20 20 20   Temp: 98 F (36.7 C) 98 F (36.7 C) 98 F (36.7 C) 97.9 F (36.6 C)  TempSrc: Oral Oral Oral Oral  SpO2: 90% 90% (!) 87% (!) 88%  Weight:      Height:      Physical Exam Vitals reviewed.  Constitutional:      General: Cynthia Meyer not in acute distress.    Appearance: Cynthia Meyer well-developed. Cynthia Meyer not ill-appearing or toxic-appearing.  Cardiovascular:     Rate and Rhythm: Normal rate and regular rhythm.     Heart sounds: No murmur heard.  No gallop.   Pulmonary:     Effort: Pulmonary effort Meyer normal.     Breath sounds: Normal breath sounds. No decreased breath sounds, wheezing, rhonchi or rales.  Neurological:     Mental Status: Cynthia Meyer alert.     LABS:  CBC Latest Ref Rng & Units 04/13/2020 04/12/2020 04/10/2020  WBC 4.0 - 10.5 K/uL 10.1 7.1 -  Hemoglobin 12.0 - 15.0 g/dL 04/12/2020 03.4 15.6(H)  Hematocrit 36 - 46 % 45.0 46.2(H) 46.0  Platelets 150 - 400 K/uL 286 247 -   CMP Latest Ref Rng & Units 04/13/2020 04/12/2020 04/10/2020  Glucose 70 - 99 mg/dL 04/12/2020) 595(G) 387(F)  BUN 6 - 20 mg/dL 20 16 15   Creatinine 0.44 - 1.00 mg/dL 643(P 2.95  Sodium 135 - 145 mmol/L 137 139 137  Potassium 3.5 - 5.1 mmol/L 4.6 4.8 3.3(L)  Chloride 98 - 111 mmol/L 99 103 98  CO2 22 - 32 mmol/L 28 28 -  Calcium 8.9 - 10.3 mg/dL  1.88) 4.16) -  Total Protein 6.5 - 8.1 g/dL - - -  Total Bilirubin 0.3 - 1.2 mg/dL - - -  Alkaline Phos 38 - 126 U/L - - -  AST 15 - 41 U/L - - -  ALT 0 - 44 U/L - - -     Assessment/Plan:  Principal Problem:   COVID-19 virus infection Active Problems:   Acute respiratory failure with hypoxia (HCC)   Type 2 diabetes mellitus with hyperglycemia (HCC)   Morbid obesity (HCC)  Cynthia Meyer Meyer a 59 y/o female with a PMH of Asthma who presents to [facility] with known COVID infection, admitted for treatment of COVID 19.  Covid-19 Hx of Asthma  Diagnosed 6/15. CTA done in ED then as well and was negative for PE. 4 Liters on [place]. Patient ambulated on 4L of Oxygen yesterday to achieve 90% saturation.  - Complete remdesivir today - Decadron 6mg  QD - albuterol inhaler prn  - C-RP: 0.6 - Ferritin: 177  - PT  - robitussin prn   Type II Diabetes Mellitis  Likely in setting or steroids. A1c of 7.5.  - SSI-resistant - Increase Lantus 20 - HS  coverage - When appropriate for D/C will DC with metformin   Prior to Admission Living Arrangement: Home Anticipated Discharge Location: Home Barriers to Discharge: None  Dispo: Anticipated discharge today.   Cynthia Mercury, MD 04/15/2020, 6:41 AM Pager: [phone] After 5pm on weekdays and 1pm on weekends: On Call pager [phone]

## 2020-04-15 NOTE — TOC Initial Note (Signed)
Transition of Care [facility]) - Initial/Assessment Note    Patient Details  Name: Cynthia Meyer MRN: [id number] Date of Birth: Aug 17, 1961  [facility]) CM/SW Contact:    Kermit Balo, RN Phone Number: 04/15/2020, 8:58 AM  Clinical Narrative:                 CM spoke to the patient over the phone. She is interested in getting set up with one of the [facility] or Covid Clinic. CM will obtain her an appt prior to d/c and place information on the AVS.  Pt will required home oxygen at d/c. CM has contacted [employer] and they are working her up for charity oxygen.  TOC following for further d/c needs.   Expected Discharge Plan: Home/Self Care Barriers to Discharge: Inadequate or no insurance, Continued Medical Work up   Patient Goals and CMS Choice     Choice offered to / list presented to : Patient  Expected Discharge Plan and Services Expected Discharge Plan: Home/Self Care   Discharge Planning Services: CM Consult Post Acute Care Choice: Durable Medical Equipment                   DME Arranged: Oxygen DME Agency: [employer] Date DME Agency Contacted: 04/14/20   Representative spoke with at DME Agency: Zack            Prior Living Arrangements/Services   Lives with:: Siblings Patient language and need for interpreter reviewed:: Yes Do you feel safe going back to the place where you live?: Yes      Need for Family Participation in Patient Care: No (Comment) Care giver support system in place?: No (comment) (sister works so she will be alone during the day)   Criminal Activity/Legal Involvement Pertinent to Current Situation/Hospitalization: No - Comment as needed  Activities of Daily Living      Permission Sought/Granted                  Emotional Assessment   Attitude/Demeanor/Rapport: Engaged Affect (typically observed): Accepting Orientation: : Oriented to Self, Oriented to Place, Oriented to  Time, Oriented to Situation   Psych  Involvement: No (comment)  Admission diagnosis:  Acute respiratory failure with hypoxia (HCC) [J96.01] COVID-19 virus infection [U07.1] COVID-19 [U07.1] Patient Active Problem List   Diagnosis Date Noted  . Type 2 diabetes mellitus with hyperglycemia (HCC) 04/11/2020  . Morbid obesity (HCC) 04/11/2020  . COVID-19 virus infection 04/10/2020  . Acute respiratory failure with hypoxia (HCC)    PCP:  Patient, No Pcp Per Pharmacy:   [id number] - [place], [place] - [address] AT [facility] OF [address] & [facility] [address] [place] [address] Phone: [phone] Fax: [phone]  [facility] [address], [place] - [address] [place] [address] Phone: [phone] Fax: [phone]  [facility] [place], [place] - [address] [place] [address] Phone: [phone] Fax: [phone]     Social Determinants of Health (SDOH) Interventions    Readmission Risk Interventions No flowsheet data found.

## 2020-04-15 NOTE — TOC Progression Note (Signed)
Transition of Care [facility]) - Progression Note    Patient Details  Name: Cynthia Meyer MRN: [id number] Date of Birth: Mar 13, 1961  [facility]) CM/SW Contact  Kermit Balo, RN Phone Number: 04/15/2020, 9:06 AM  Clinical Narrative:    CM has set her up with the post covid clinic at [facility]. Appt on the AVS.  Pt has qualified for charity home oxygen. ToC following.  Expected Discharge Plan: Home/Self Care Barriers to Discharge: Inadequate or no insurance, Continued Medical Work up  Expected Discharge Plan and Services Expected Discharge Plan: Home/Self Care   Discharge Planning Services: CM Consult Post Acute Care Choice: Durable Medical Equipment                   DME Arranged: Oxygen DME Agency: [employer] Date DME Agency Contacted: 04/14/20   Representative spoke with at DME Agency: Zack             Social Determinants of Health (SDOH) Interventions    Readmission Risk Interventions No flowsheet data found.

## 2020-04-27 ENCOUNTER — Other Ambulatory Visit: Payer: Self-pay

## 2020-04-27 ENCOUNTER — Ambulatory Visit (INDEPENDENT_AMBULATORY_CARE_PROVIDER_SITE_OTHER): Payer: Self-pay | Admitting: Nurse Practitioner

## 2020-04-27 ENCOUNTER — Encounter: Payer: Self-pay | Admitting: General Practice

## 2020-04-27 VITALS — HR 80

## 2020-04-27 DIAGNOSIS — E1165 Type 2 diabetes mellitus with hyperglycemia: Secondary | ICD-10-CM

## 2020-04-27 DIAGNOSIS — R0602 Shortness of breath: Secondary | ICD-10-CM

## 2020-04-27 DIAGNOSIS — Z8616 Personal history of COVID-19: Secondary | ICD-10-CM

## 2020-04-27 NOTE — Progress Notes (Signed)
@Patient  ID: , female    DOB: 1961-03-13, 59 y.o.   MRN: [id number]  Chief Complaint  Patient presents with  . Hospitalization Follow-up    covid pneumonia, respiratory failure    Referring provider: No ref. provider found   59 year old female with no significant health history other than obesity.  Diagnosed with Covid in June 2021.  HPI   Patient presents today for post Covid care clinic visit.  Patient does not currently have a PCP.  Patient states that she does not want a PCP.  Patient was recently admitted to the hospital for Covid pneumonia and was found to have new diagnosis of type 2 diabetes.  Patient was treated with remdesivir Decadron, albuterol, and Robitussin.  Patient was on 7 L of oxygen while in the hospital.  She was weaned down to 3 to 4 L of O2 and discharged home with O2.  She was also discharged home on Metformin and albuterol inhaler.  Patient states that she has not taken the Metformin and does not think that she is diabetic.  She states that her blood sugars were only elevated due to steroid use in the hospital.  I discussed with her the fact that her hemoglobin A1c was 7.5 and that patient needs a PCP to follow her for diabetes.  Patient states that she refuses to take the Metformin and that when she takes her blood sugar at home it is 35.  Patient also states that she refuses to wear O2.  When walked in the office today her O2 sats did drop to 89% on room air with minimal exertion.  We discussed that she does need O2 at around 2 to 3 L with exertion that patient refuses to wear O2.  She states that at home her pulse oximeter shows that her oxygen is over 100.  I discussed with her that most likely this is not her SPO2 but rather her heart rate.  I advised patient to bring her pulse oximeter to the next visit so that we can check it.  Patient states that she is short of breath with exertion but states that she was like this before she had Covid.  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  Patient has not been using albuterol inhaler. Denies f/c/s, n/v/d, hemoptysis, PND, chest pain or edema.  Note: patient walked in office today and O2 sats dropped to 89% with minimal exertion. Patient recovered quickly at rest.      No Known Allergies  Immunization History  Administered Date(s) Administered  . Tdap 09/18/2012    Past Medical History:  Diagnosis Date  . Arthritis   . Asthma     Tobacco History: Social History   Tobacco Use  Smoking Status Former Smoker  . Quit date: 54  . Years since quitting: 28.5  Smokeless Tobacco Never Used   Counseling given: Yes   Outpatient Encounter Medications as of 04/27/2020  Medication Sig  . albuterol (PROVENTIL HFA;VENTOLIN HFA) 108 (90 BASE) MCG/ACT inhaler Inhale 2 puffs into the lungs every 6 (six) hours as needed for wheezing or shortness of breath.   . Ascorbic Acid (VITAMIN C) 1000 MG tablet Take 1,000 mg by mouth daily.  06/28/2020 aspirin-acetaminophen-caffeine (EXCEDRIN MIGRAINE) 250-250-65 MG per tablet Take 2 tablets by mouth every 8 (eight) hours as needed for headache or migraine. For migraines.   Marland Kitchen ibuprofen (ADVIL,MOTRIN) 200 MG tablet Take 400 mg by mouth every 6 (  six) hours as needed for fever (pain).   . metFORMIN (GLUCOPHAGE) 500 MG tablet Take 1 tablet (500 mg total) by mouth 2 (two) times daily with a meal.  . predniSONE (DELTASONE) 20 MG tablet Take 60 mg daily x 2 days then 40 mg daily x 2 days then 20 mg daily x 2 days (Patient taking differently: Take 20-60 mg by mouth See admin instructions. Tapered course started 04/07/2020: take 3 tablets (60 mg) daily x 2 days then 2 tablets (40 mg) daily x 2 days, then  1 tablet (20 mg) daily x 2 days)   No facility-administered encounter medications on file as of 04/27/2020.     Review of Systems  Review of Systems  Constitutional: Negative.  Negative for fatigue and fever.  HENT: Negative.   Respiratory:  Positive for shortness of breath. Negative for cough.   Cardiovascular: Negative.  Negative for chest pain, palpitations and leg swelling.  Gastrointestinal: Negative.   Allergic/Immunologic: Negative.   Neurological: Negative.   Psychiatric/Behavioral: Negative.        Physical Exam  Pulse 80   Wt Readings from Last 5 Encounters:  04/11/20 (!) 327 lb 9.7 oz (148.6 kg)  10/16/15 289 lb (131.1 kg)  10/02/12 289 lb (131.1 kg)     Physical Exam Vitals and nursing note reviewed.  Constitutional:      General: She is not in acute distress.    Appearance: She is well-developed.  Cardiovascular:     Rate and Rhythm: Normal rate and regular rhythm.  Pulmonary:     Effort: Pulmonary effort is normal.     Breath sounds: Normal breath sounds.  Musculoskeletal:     Right lower leg: No edema.     Left lower leg: No edema.  Neurological:     Mental Status: She is alert and oriented to person, place, and time.      Imaging: CT Angio Chest PE W and/or Wo Contrast  Result Date: 04/06/2020 CLINICAL DATA:  Shortness of breath chest pain EXAM: CT ANGIOGRAPHY CHEST WITH CONTRAST TECHNIQUE: Multidetector CT imaging of the chest was performed using the standard protocol during bolus administration of intravenous contrast. Multiplanar CT image reconstructions and MIPs were obtained to evaluate the vascular anatomy. CONTRAST:  OMNIPAQUE IOHEXOL 350 MG/ML SOLN COMPARISON:  None. FINDINGS: Cardiovascular: There is a optimal opacification of the pulmonary arteries. There is no central,segmental, or subsegmental filling defects within the pulmonary arteries. The heart is normal in size. There is a trace pericardial effusion. No evidence right heart strain. There is normal three-vessel brachiocephalic anatomy without proximal stenosis. Scattered mild aortic atherosclerosis is seen. Mediastinum/Nodes: No hilar, mediastinal, or axillary adenopathy. Thyroid gland, trachea, and esophagus demonstrate  no significant findings. Lungs/Pleura: Patchy peripherally based ground-glass opacities are seen predominantly within both lower lungs, left greater right. No pleural effusion or pneumothorax. No airspace consolidation. Upper Abdomen: No acute abnormalities present in the visualized portions of the upper abdomen. Musculoskeletal: No chest wall abnormality. No acute or significant osseous findings. Review of the MIP images confirms the above findings. IMPRESSION: No central, segmental, or subsegmental pulmonary embolism. Multifocal patchy airspace opacities seen of both lower lungs, left greater than right which could be due to atelectasis and/or infectious etiology. Trace pericardial effusion Aortic Atherosclerosis (ICD10-I70.0). Electronically Signed   By: Jonna Clark M.D.   On: 04/06/2020 22:16   DG Chest Port 1 View  Result Date: 04/10/2020 CLINICAL DATA:  Shortness of breath and chest pressure. EXAM: PORTABLE CHEST 1 VIEW  COMPARISON:  April 06, 2020 FINDINGS: Mild diffuse chronic appearing increased lung markings are seen. Mild atelectasis is seen within the bilateral lung bases and mid left lung. There is no evidence of a pleural effusion or pneumothorax. The cardiac silhouette is borderline in size. Degenerative changes seen throughout the thoracic spine. IMPRESSION: Chronic appearing increased lung markings with mild bibasilar and mid left lung atelectasis. Electronically Signed   By: Aram Candela M.D.   On: 04/10/2020 15:14   DG Chest Port 1 View  Result Date: 04/06/2020 CLINICAL DATA:  Chest pain, fatigue EXAM: PORTABLE CHEST 1 VIEW COMPARISON:  02/06/2004 FINDINGS: Single frontal view of the chest demonstrates an unremarkable cardiac silhouette. No airspace disease, effusion, or pneumothorax. Chronic elevation right hemidiaphragm. IMPRESSION: 1. No acute intrathoracic process. Electronically Signed   By: Sharlet Salina M.D.   On: 04/06/2020 21:02     Assessment & Plan:   History of  COVID-19 Covid Pneumonia Acute hypoxic respiratory failure:  Assessment: Patient is noncompliant with O2 at home. She does not use her inhaler. Advised to please bring pulse oximeter to next appointment to be checked.   Will check chest x ray  Continue deep breathing exercises  Walked in office today - O2 Sats dropped to 89% on RA - recovered quickly at rest - heart rate stable - advised patient to please wear O2 with exertion and be on RA at rest  Diabetes:  Assessment: Patient is in denial about new diagnoses of diabetes. She refuses to take metformin as directed. She states that her blood sugar has been in the low 30's range when checked at home. Will order labs with blood glucose today.   Will check labs today  Please keep log of blood sugars  Follow up:  Follow up in 1 week or sooner if needed      Ivonne Andrew, NP 04/28/2020

## 2020-04-27 NOTE — Patient Instructions (Signed)
History of covid Covid Pneumonia:  Will check chest x ray  Continue deep breathing exercises  Walked in office today - O2 Sats dropped to 89% on RA - recovered quickly at rest - heart rate stable  Diabetes:  Will check labs today  Please keep log of blood sugars  Follow up:  follow up in 1 week or sooner if needed

## 2020-04-28 DIAGNOSIS — Z8616 Personal history of COVID-19: Secondary | ICD-10-CM | POA: Insufficient documentation

## 2020-04-28 DIAGNOSIS — R0602 Shortness of breath: Secondary | ICD-10-CM | POA: Insufficient documentation

## 2020-04-28 NOTE — Assessment & Plan Note (Signed)
Covid Pneumonia Acute hypoxic respiratory failure:  Assessment: Patient is noncompliant with O2 at home. She does not use her inhaler. Advised to please bring pulse oximeter to next appointment to be checked.   Will check chest x ray  Continue deep breathing exercises  Walked in office today - O2 Sats dropped to 89% on RA - recovered quickly at rest - heart rate stable - advised patient to please wear O2 with exertion and be on RA at rest  Diabetes:  Assessment: Patient is in denial about new diagnoses of diabetes. She refuses to take metformin as directed. She states that her blood sugar has been in the low 30's range when checked at home. Will order labs with blood glucose today.   Will check labs today  Please keep log of blood sugars  Follow up:  Follow up in 1 week or sooner if needed

## 2020-05-03 ENCOUNTER — Ambulatory Visit: Payer: Self-pay

## 2020-05-10 ENCOUNTER — Ambulatory Visit: Payer: Self-pay

## 2020-05-11 ENCOUNTER — Ambulatory Visit: Payer: Self-pay

## 2021-06-08 IMAGING — CT CT ANGIO CHEST
2 of 6 series · 18 of 46 positions shown · IV contrast (APPLIED)
Comparison: None.

CLINICAL DATA: Shortness of breath chest pain

EXAM:
CT ANGIOGRAPHY CHEST WITH CONTRAST
TECHNIQUE: Multidetector CT imaging of the chest was performed using the
standard protocol during bolus administration of intravenous
contrast. Multiplanar CT image reconstructions and MIPs were
obtained to evaluate the vascular anatomy.
CONTRAST:  100mL OMNIPAQUE IOHEXOL 350 MG/ML SOLN

[Series 7: thins · axial · 0.63mm/px · z∈[+1040,+1331]mm · 15 of 456 slices shown]
[im 20/456  lung]
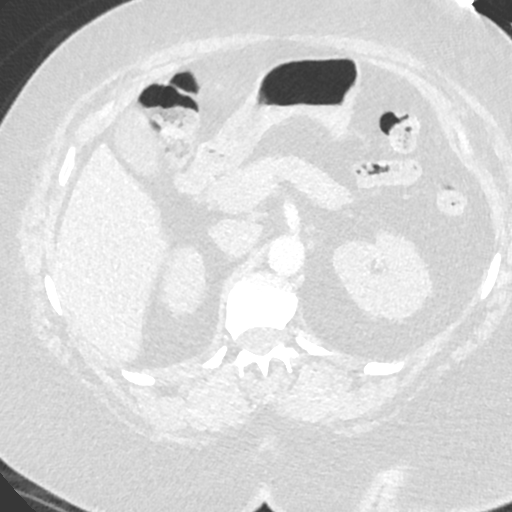
[im 60/456  soft-tissue]
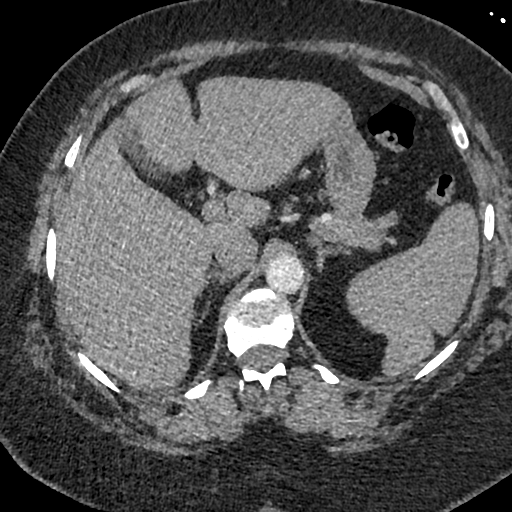
[im 80/456  lung]
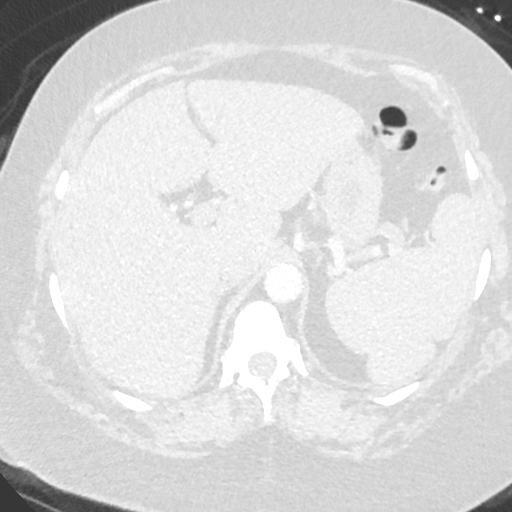
[im 119/456  soft-tissue]
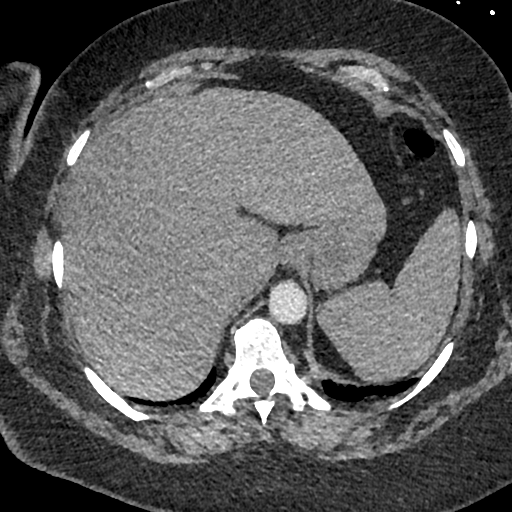
[im 139/456  lung]
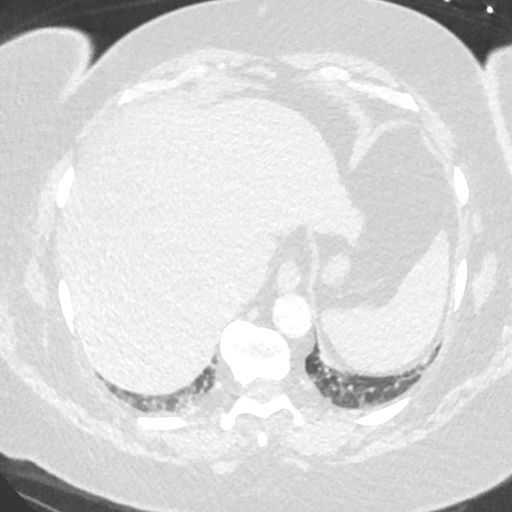
[im 179/456  soft-tissue]
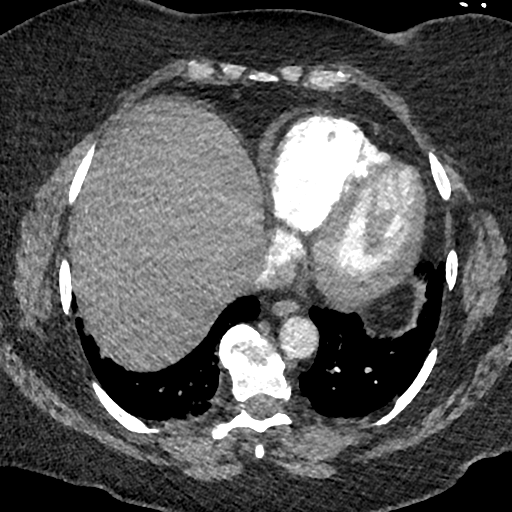
[im 198/456  lung]
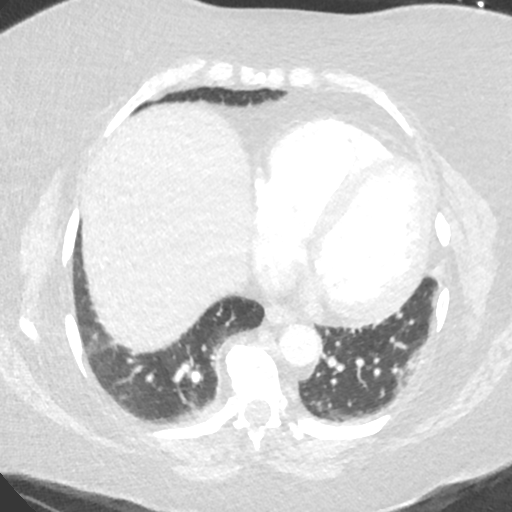
[im 238/456  soft-tissue]
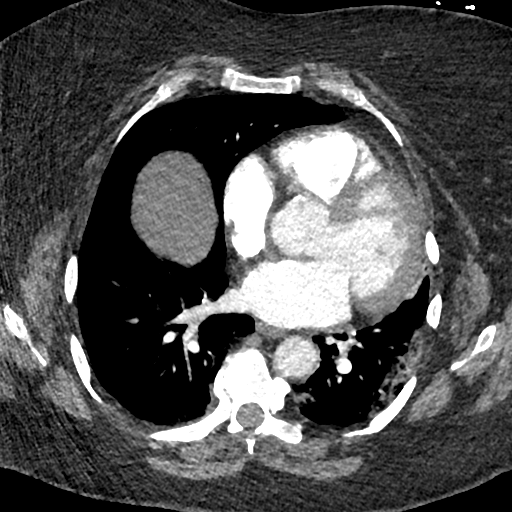
[im 258/456  lung]
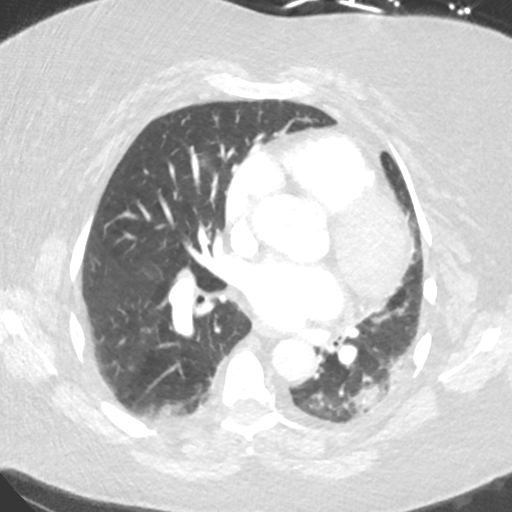
[im 277/456  soft-tissue]
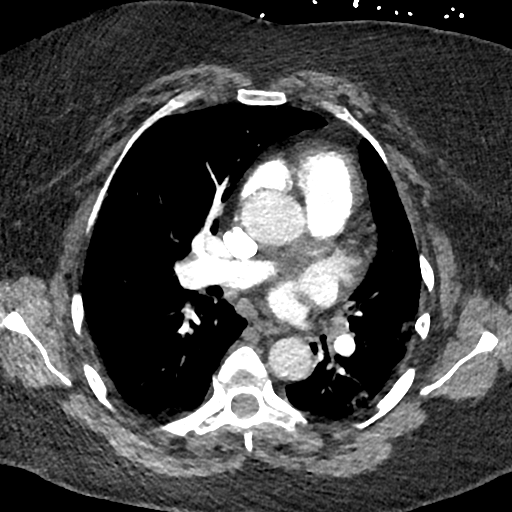
[im 317/456  lung]
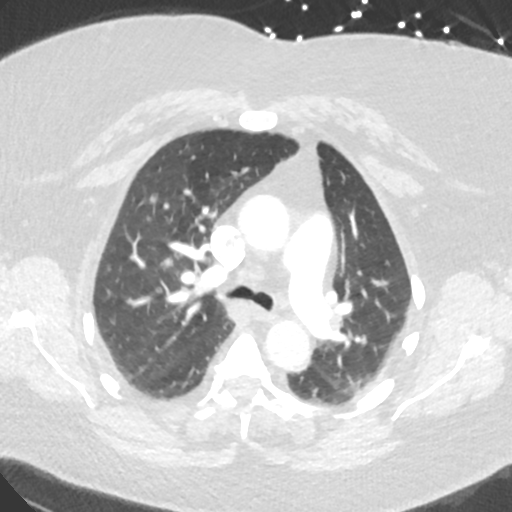
[im 337/456  soft-tissue]
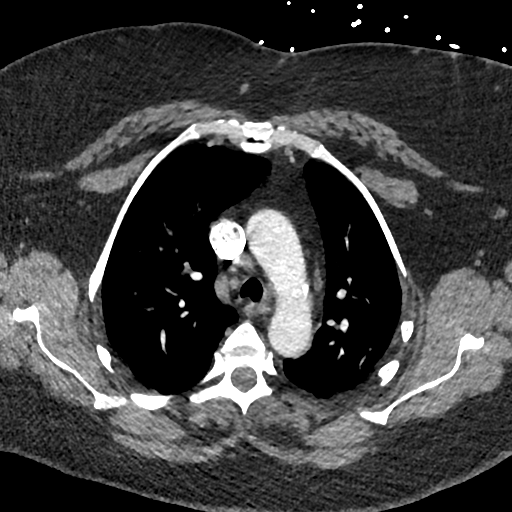
[im 376/456  lung]
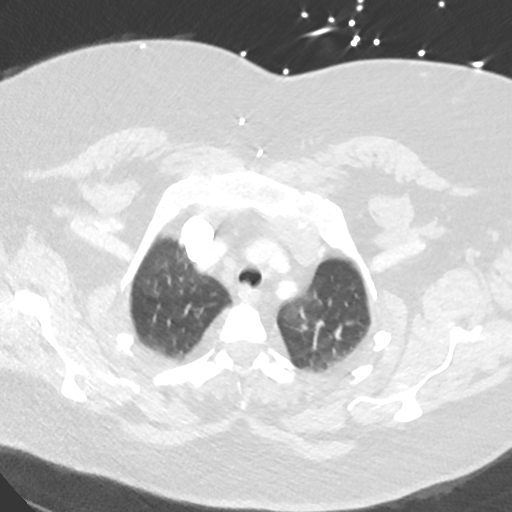
[im 396/456  soft-tissue]
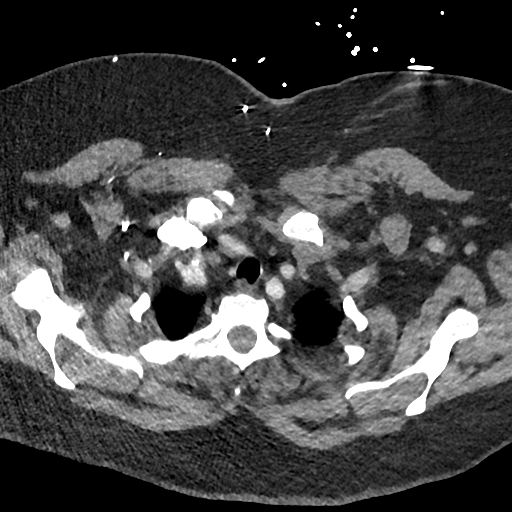
[im 436/456  lung]
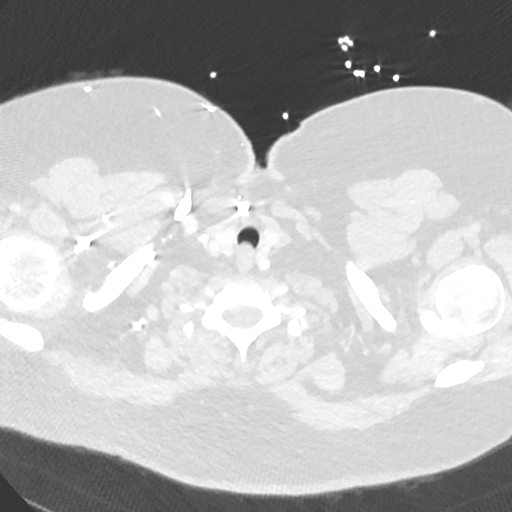

[Series 8: cor · coronal · 0.54mm/px · 3 of 176 slices shown]
[im 44/176  soft-tissue]
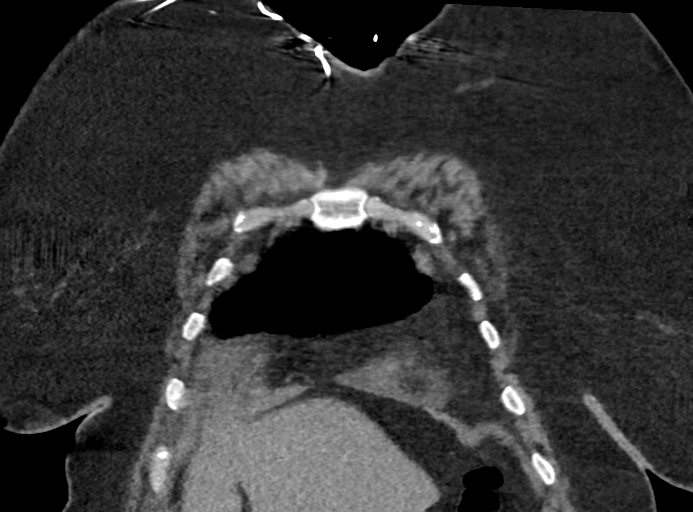
[im 88/176  soft-tissue]
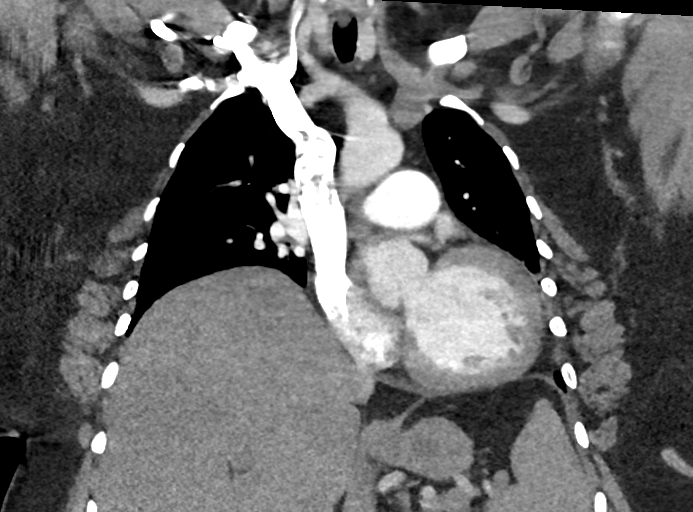
[im 132/176  soft-tissue]
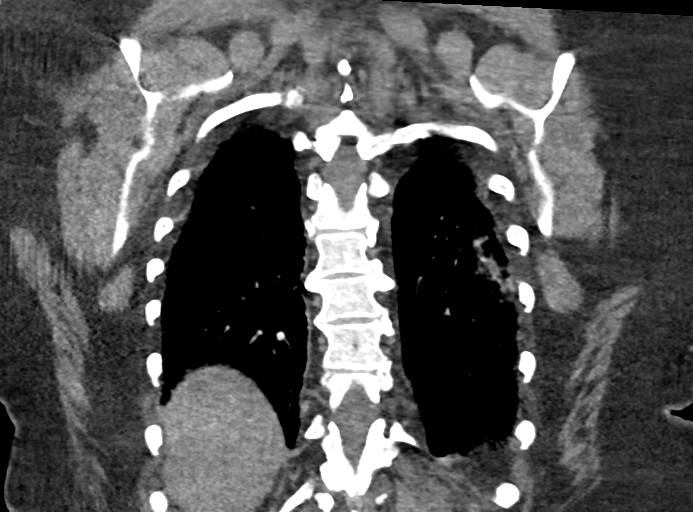

[18 of 46 positions shown; findings below may reference images not displayed]

FINDINGS: Cardiovascular: There is a optimal opacification of the pulmonary
arteries. There is no central,segmental, or subsegmental filling
defects within the pulmonary arteries. The heart is normal in size.
There is a trace pericardial effusion. No evidence right heart
strain. There is normal three-vessel brachiocephalic anatomy without
proximal stenosis. Scattered mild aortic atherosclerosis is seen.

Mediastinum/Nodes: No hilar, mediastinal, or axillary adenopathy.
Thyroid gland, trachea, and esophagus demonstrate no significant
findings.

Lungs/Pleura: Patchy peripherally based ground-glass opacities are
seen predominantly within both lower lungs, left greater right. No
pleural effusion or pneumothorax. No airspace consolidation.

Upper Abdomen: No acute abnormalities present in the visualized
portions of the upper abdomen.

Musculoskeletal: No chest wall abnormality. No acute or significant
osseous findings.

Review of the MIP images confirms the above findings.
IMPRESSION: No central, segmental, or subsegmental pulmonary embolism.

Multifocal patchy airspace opacities seen of both lower lungs, left
greater than right which could be due to atelectasis and/or
infectious etiology.

Trace pericardial effusion

Aortic Atherosclerosis (61916-503.3).

## 2021-06-12 IMAGING — DX DG CHEST 1V PORT
1 series · 1 of 1 positions shown · non-contrast
Comparison: April 06, 2020

CLINICAL DATA: Shortness of breath and chest pressure.

EXAM:
PORTABLE CHEST 1 VIEW

[chest]
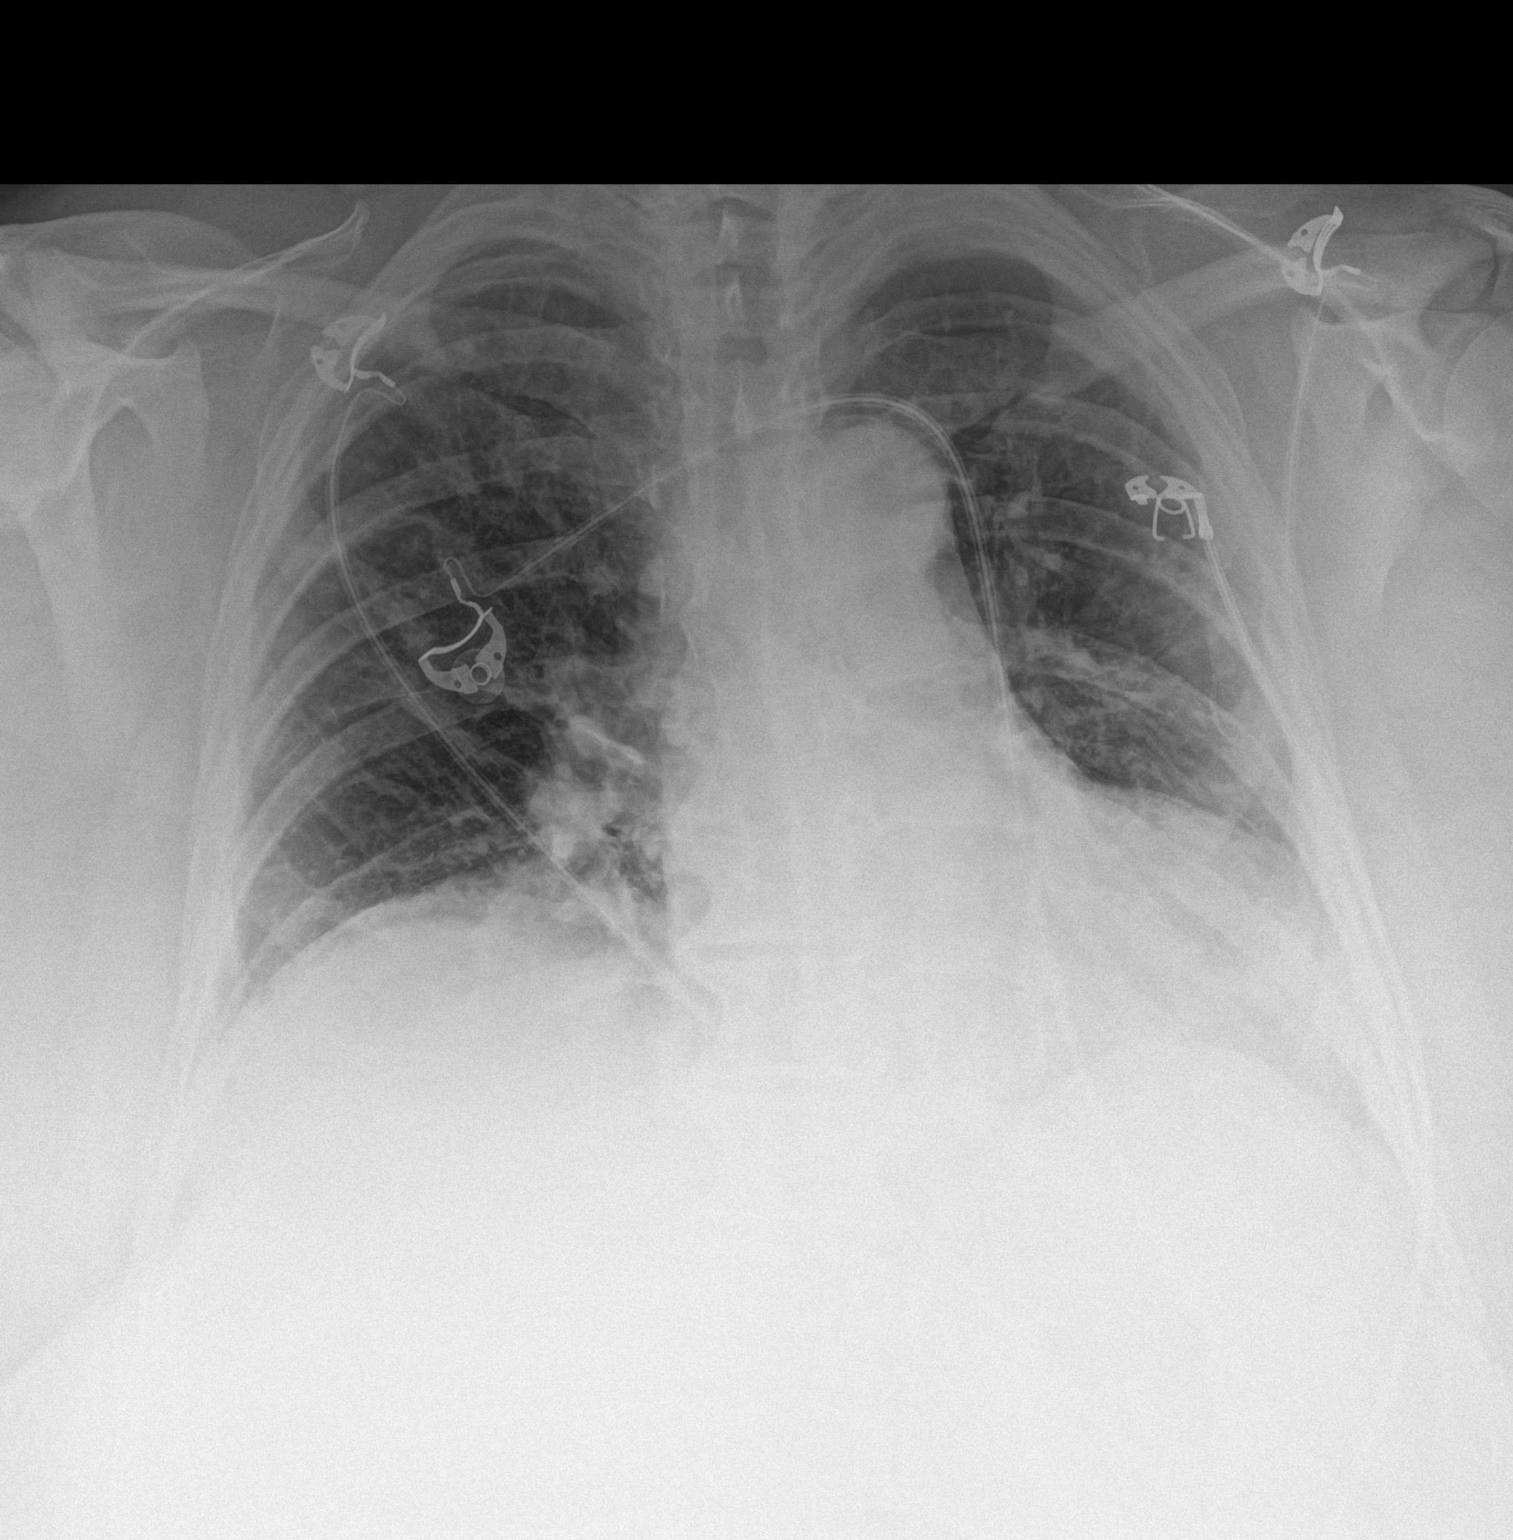

[1 of 1 positions shown; findings below may reference images not displayed]

FINDINGS: Mild diffuse chronic appearing increased lung markings are seen.
Mild atelectasis is seen within the bilateral lung bases and mid
left lung. There is no evidence of a pleural effusion or
pneumothorax. The cardiac silhouette is borderline in size.
Degenerative changes seen throughout the thoracic spine.
IMPRESSION: Chronic appearing increased lung markings with mild bibasilar and
mid left lung atelectasis.
